# Patient Record
Sex: Female | Born: 1955 | Race: Black or African American | Hispanic: No | Smoking: Never smoker
Health system: Southern US, Community
[De-identification: ages and names within clinical notes are randomized; demographics above are authoritative.]

## PROBLEM LIST (undated history)

## (undated) DIAGNOSIS — E038 Other specified hypothyroidism: Secondary | ICD-10-CM

## (undated) DIAGNOSIS — E039 Hypothyroidism, unspecified: Secondary | ICD-10-CM

## (undated) HISTORY — DX: Hypothyroidism, unspecified: E03.9

## (undated) HISTORY — DX: Other specified hypothyroidism: E03.8

## (undated) HISTORY — PX: EYELID LACERATION REPAIR: SHX1564

---

## 2006-12-02 ENCOUNTER — Emergency Department (HOSPITAL_COMMUNITY): Admission: EM | Admit: 2006-12-02 | Discharge: 2006-12-02 | Payer: Self-pay | Admitting: Family Medicine

## 2008-06-03 IMAGING — CR DG CHEST 2V
2 series · 2 of 2 positions shown · non-contrast
Comparison: None available.

CLINICAL DATA: Cough.  
 CHEST - 2 VIEW:

[view not recorded (1 of 2)]
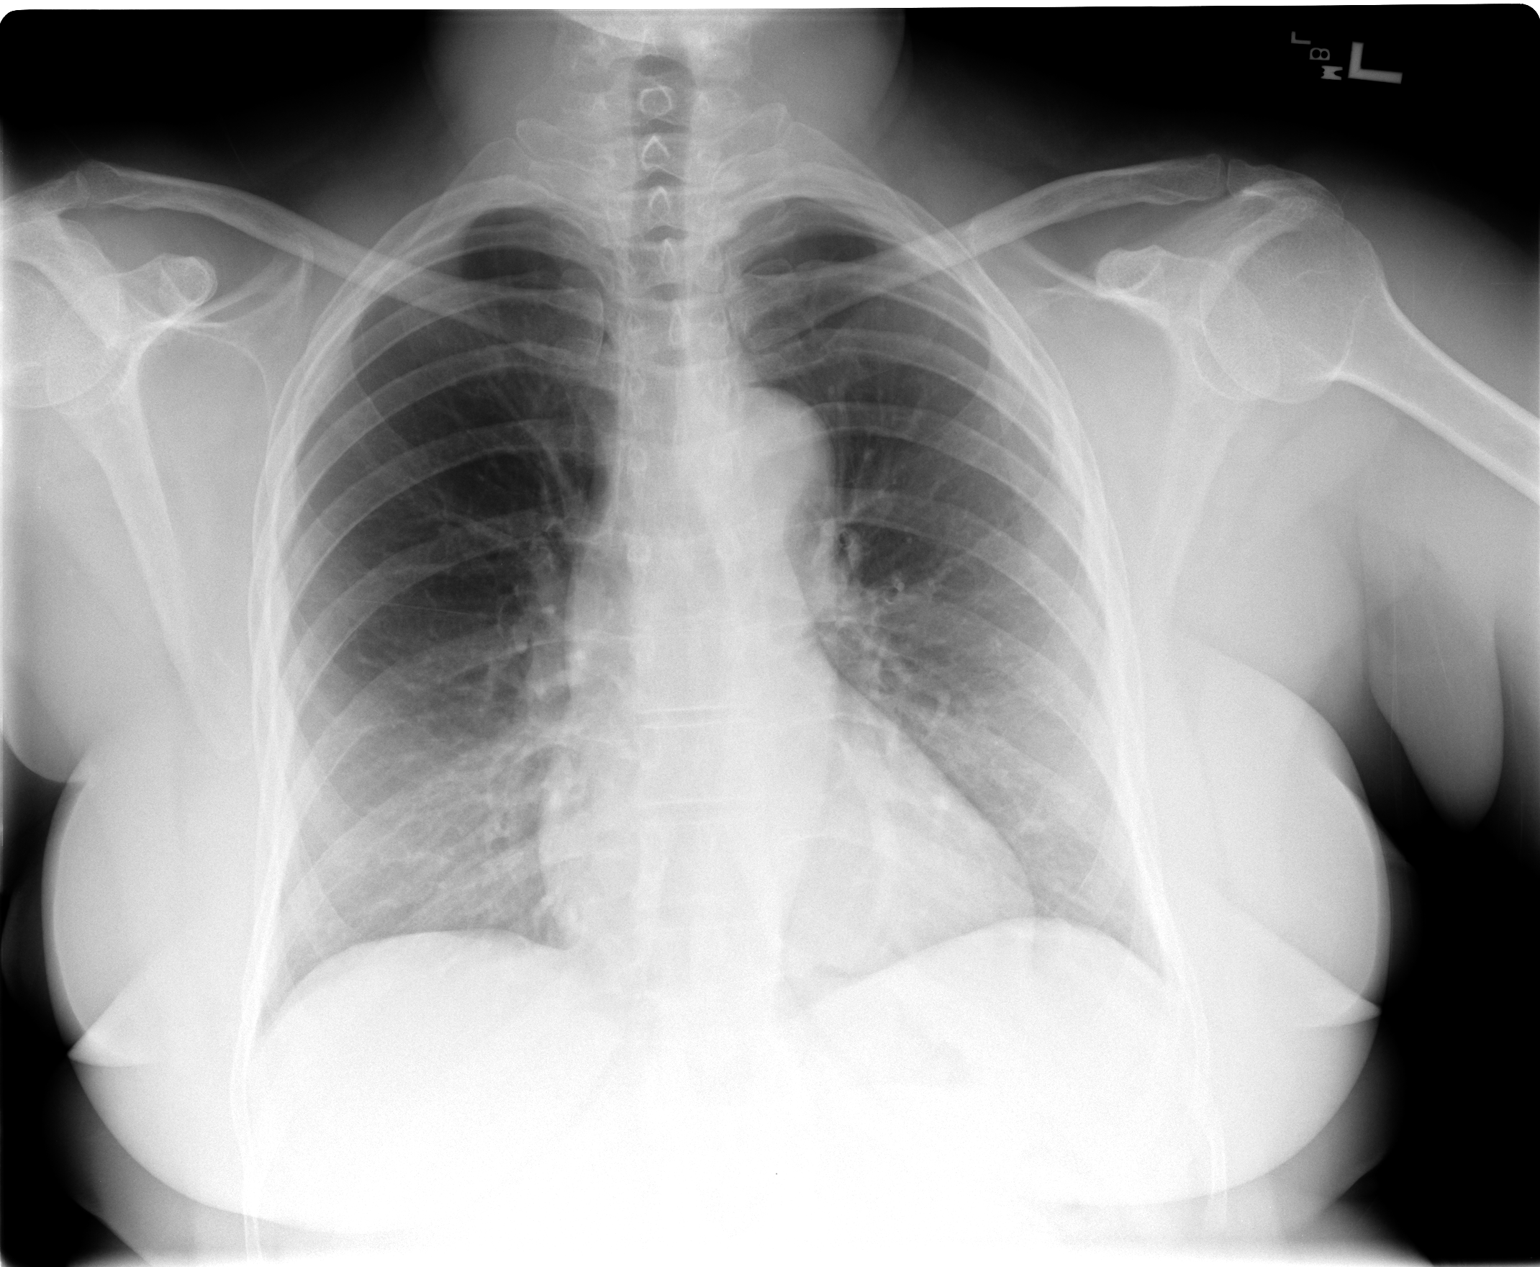

[view not recorded (2 of 2)]
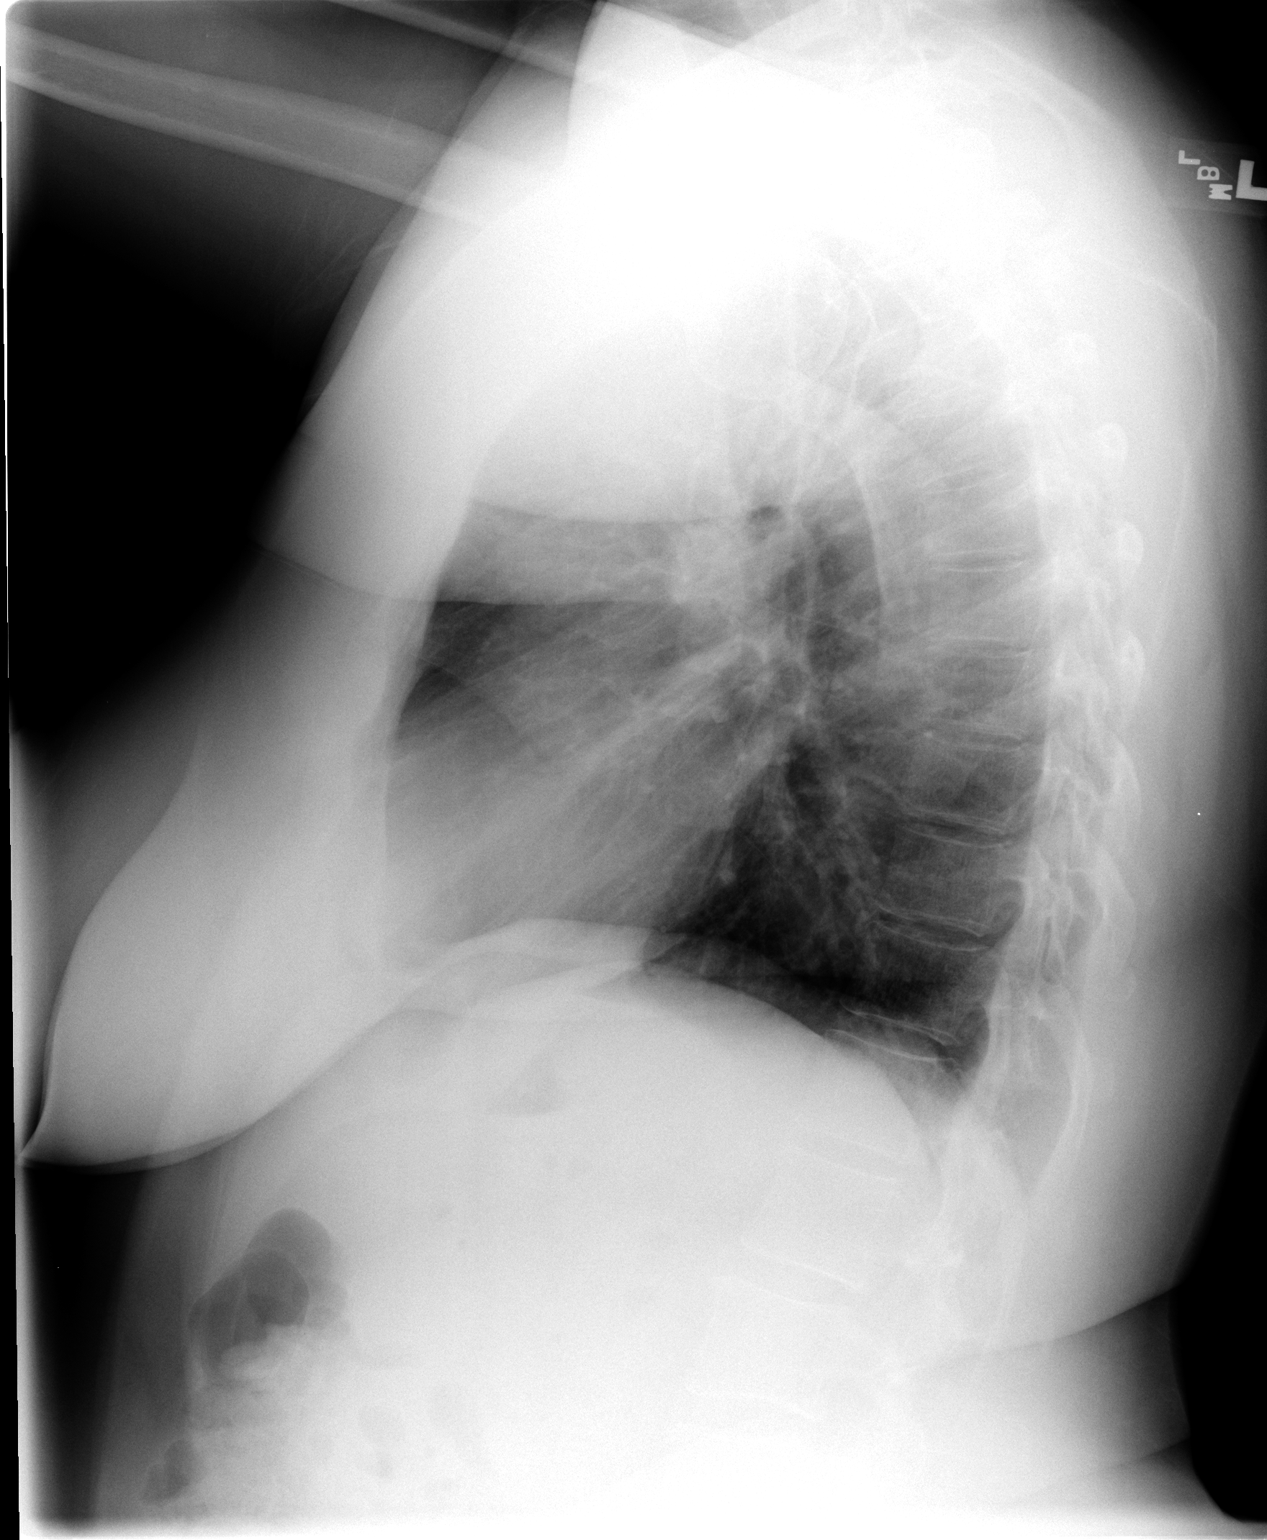

[2 of 2 positions shown; findings below may reference images not displayed]

FINDINGS: Trachea is midline.  Heart size normal.  Lungs are clear.  No pleural fluid.
IMPRESSION: No acute findings.

## 2013-03-25 ENCOUNTER — Ambulatory Visit: Payer: Self-pay | Admitting: Obstetrics and Gynecology

## 2014-09-25 IMAGING — US ABDOMEN ULTRASOUND
1 series · 14 of 25 positions shown · non-contrast
Comparison: none

REASON FOR EXAM: RUQ pain
COMMENTS:

PROCEDURE:     US  - US ABDOMEN GENERAL SURVEY  - March 25, 2013 [DATE]
RESULT:

[Series 1: abdomen ultrasound · 0.26mm/px · 14 of 91 slices shown]
[im 1/91]
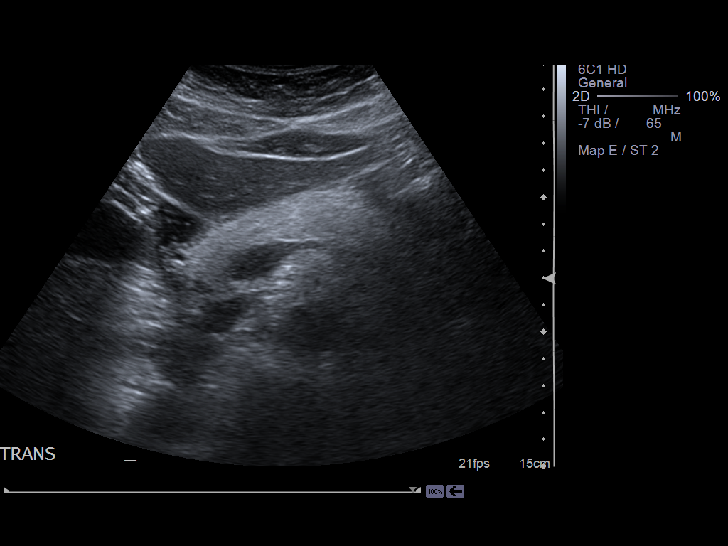
[im 8/91]
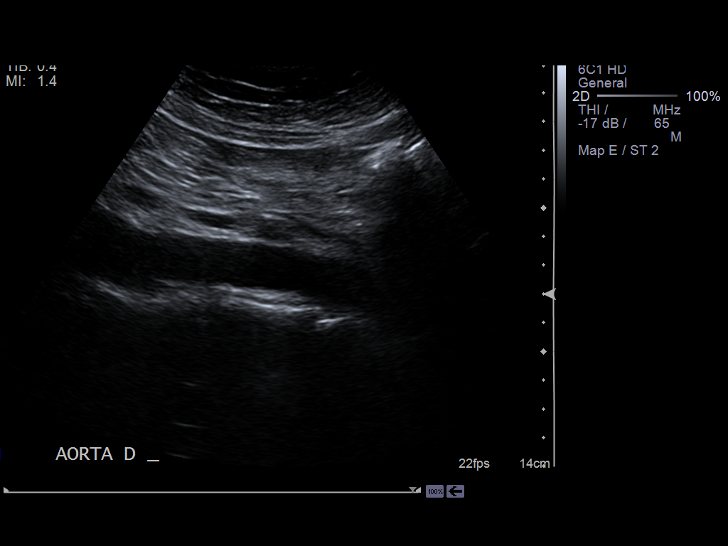
[im 16/91]
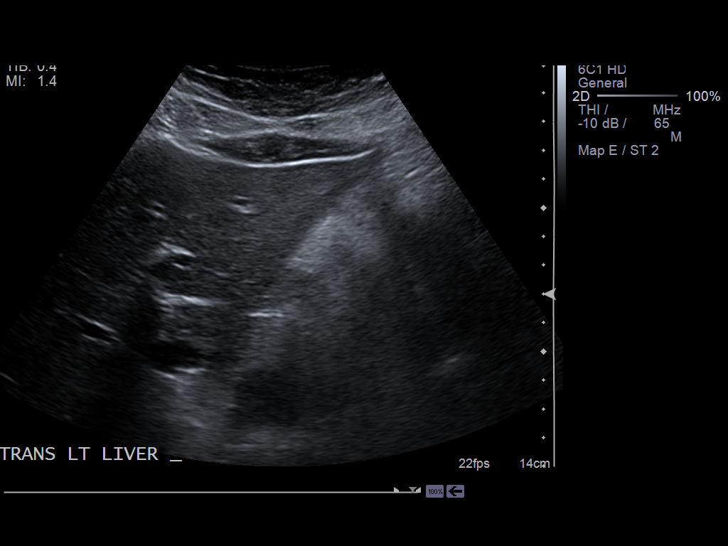
[im 23/91]
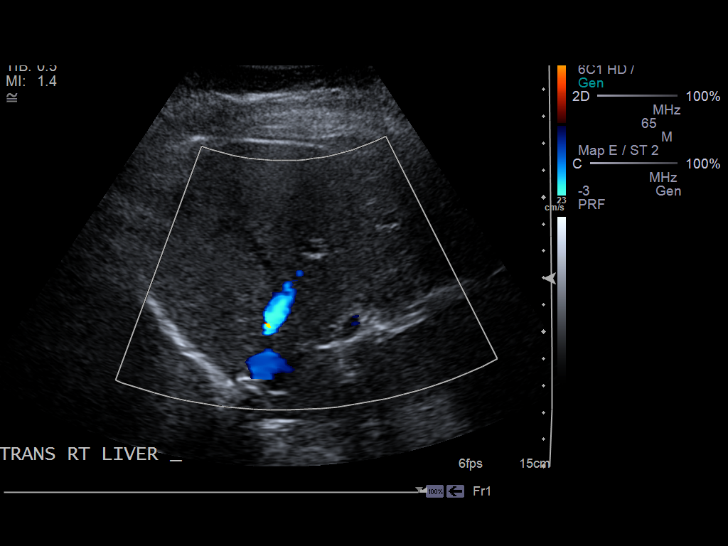
[im 31/91]
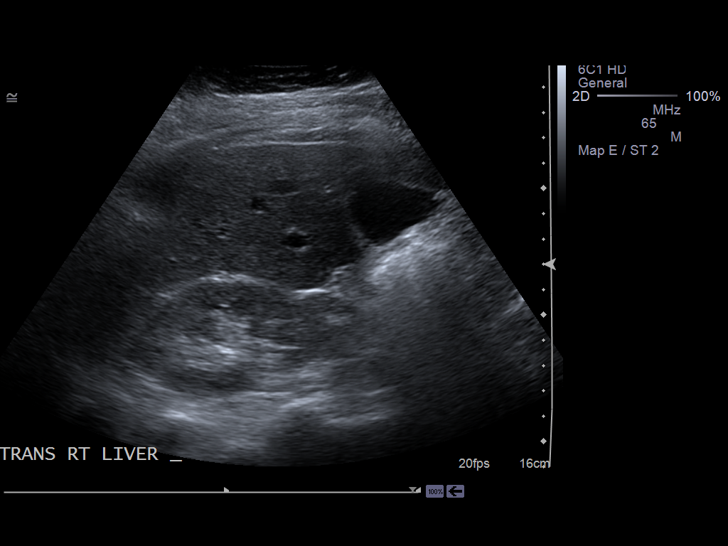
[im 34/91]
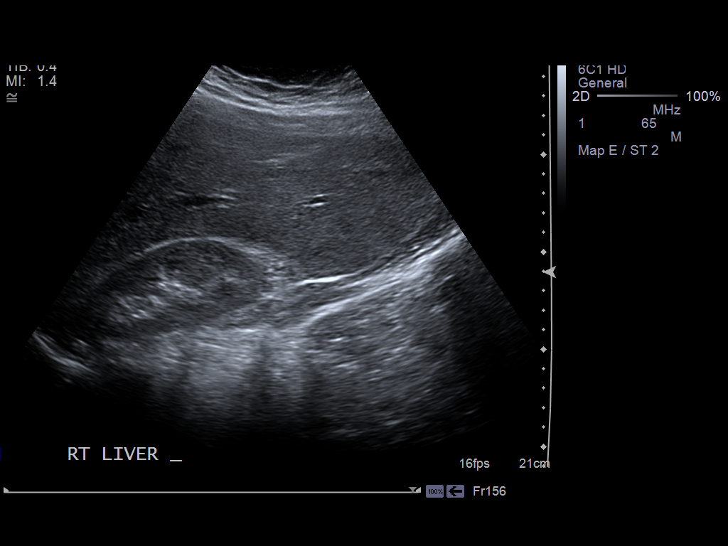
[im 42/91]
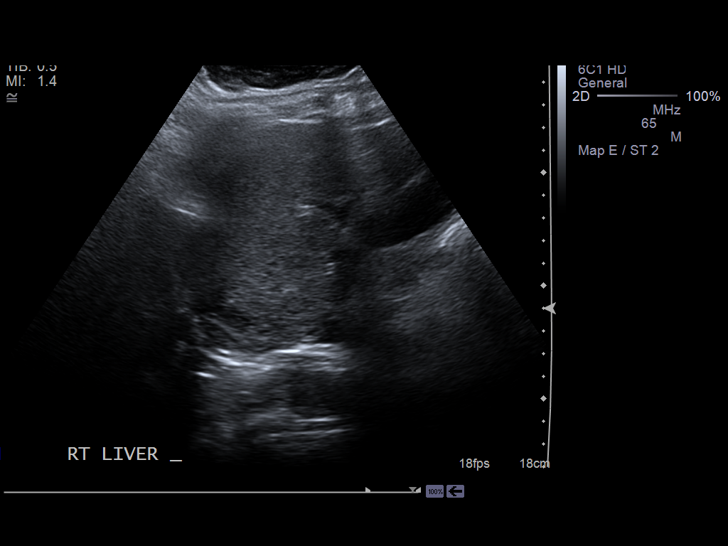
[im 49/91]
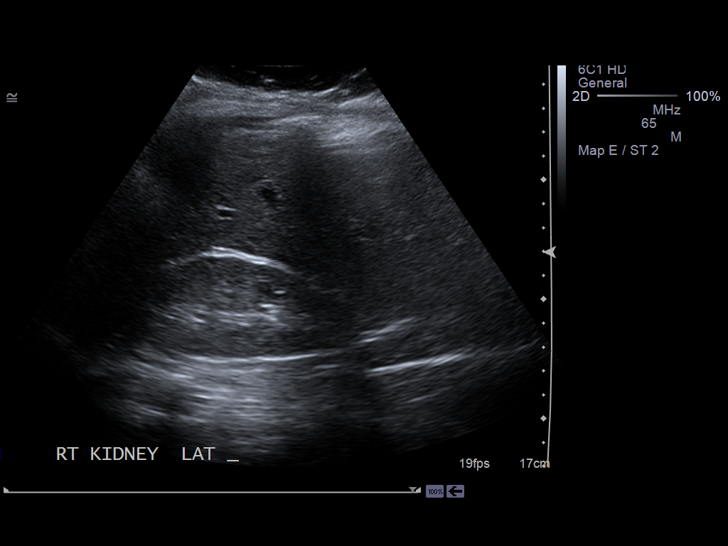
[im 57/91]
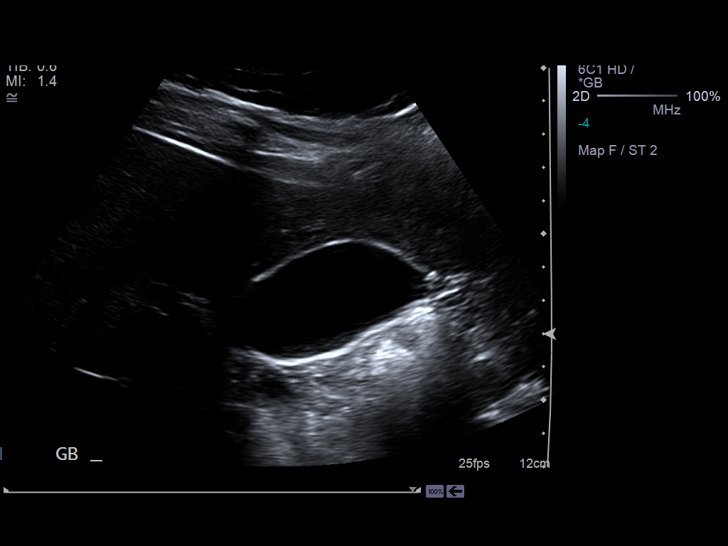
[im 61/91]
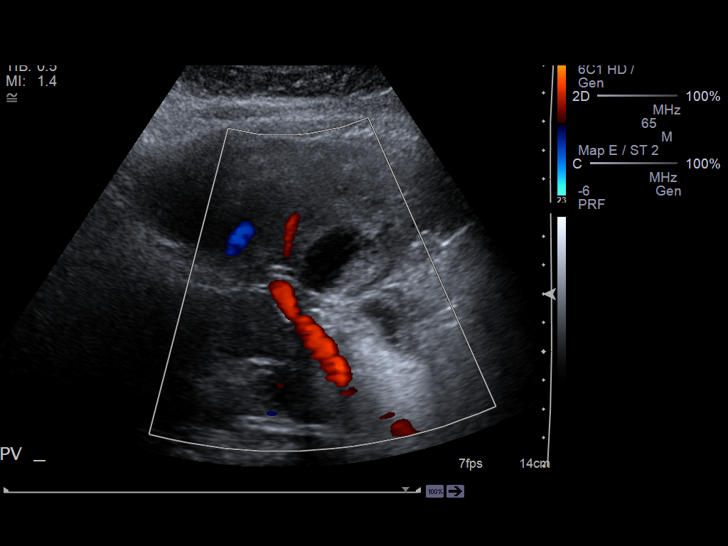
[im 68/91]
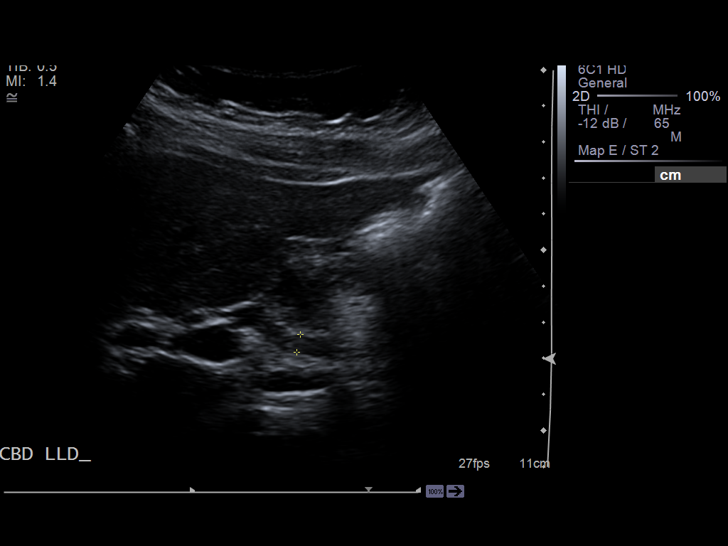
[im 76/91]
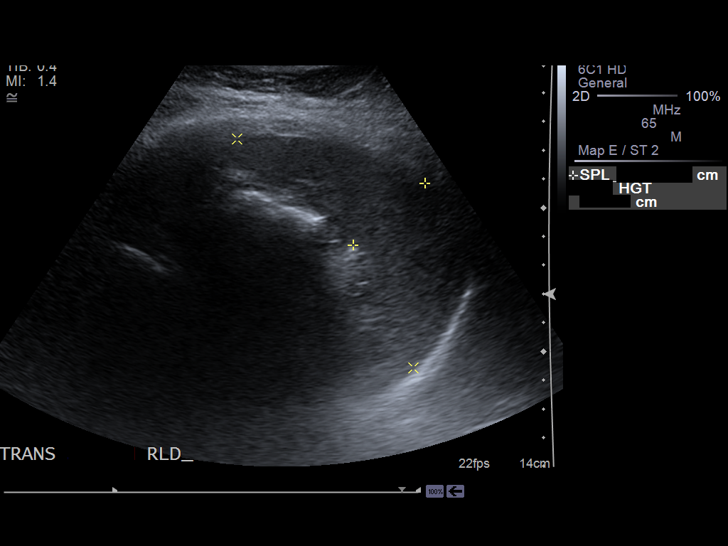
[im 83/91]
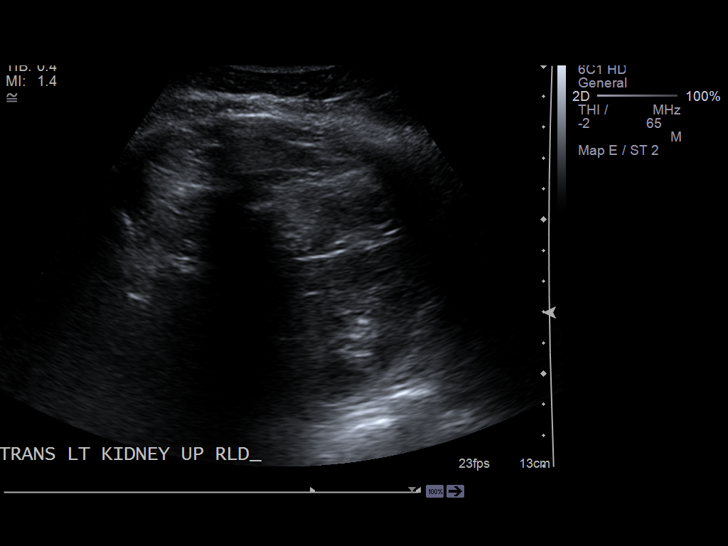
[im 91/91]
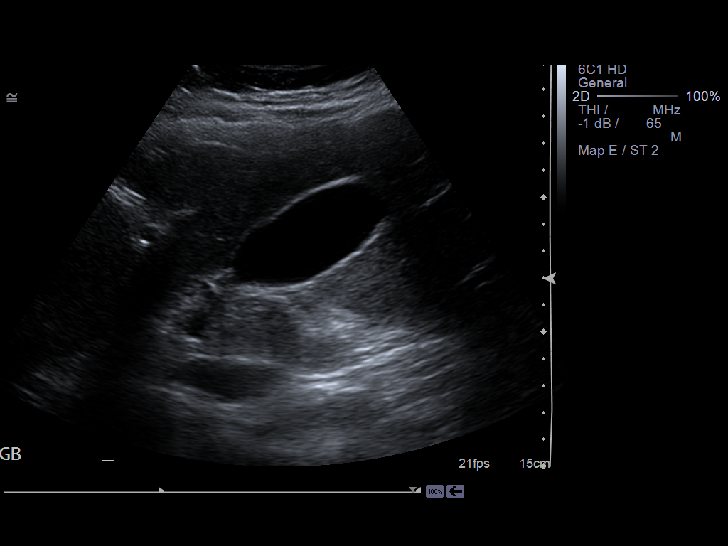

[14 of 25 positions shown; findings below may reference images not displayed]

FINDINGS: The liver demonstrates a homogeneous echotexture and measures
22.03 cm along the midclavicular line. A cyst is identified within the left
lobe of the liver. Hepatopetal flow is identified within the portal vein.
The aorta and IVC are unremarkable. The visualized portions of the pancreas
are unremarkable. The spleen demonstrates a homogeneous echotexture and
measures 8.89 cm in longitudinal dimensions. The gallbladder fossa is
unremarkable without evidence of pericholecystic fluid, gallstones,
sludging, sonographic Murphy's sign, gallbladder wall thickening, or common
bile duct dilation. Gallbladder wall is 1.8 mm in thickness and the common
bile duct measures 5.1 mm in diameter.

The right kidney measures 11.44 x 4.62 x 4.27 cm and the left 12.05 x 4.32 x
4.48 cm. There is appropriate corticomedullary differentiation without
evidence of hydronephrosis, solid or cystic masses nor calculi.
IMPRESSION: 1. Cyst within the left lobe of the liver.
2. Hepatomegaly.
3. Otherwise unremarkable abdominal ultrasound.

## 2016-01-10 ENCOUNTER — Other Ambulatory Visit: Payer: Self-pay | Admitting: Obstetrics and Gynecology

## 2016-01-10 DIAGNOSIS — Z1231 Encounter for screening mammogram for malignant neoplasm of breast: Secondary | ICD-10-CM

## 2016-01-31 ENCOUNTER — Ambulatory Visit
Admission: RE | Admit: 2016-01-31 | Discharge: 2016-01-31 | Disposition: A | Discharge status: Home / Self Care | Payer: BLUE CROSS/BLUE SHIELD | Source: Ambulatory Visit | Attending: Obstetrics and Gynecology | Admitting: Obstetrics and Gynecology

## 2016-01-31 ENCOUNTER — Other Ambulatory Visit: Payer: Self-pay | Admitting: Obstetrics and Gynecology

## 2016-01-31 DIAGNOSIS — R059 Cough, unspecified: Secondary | ICD-10-CM

## 2016-01-31 DIAGNOSIS — Z1231 Encounter for screening mammogram for malignant neoplasm of breast: Secondary | ICD-10-CM

## 2016-01-31 DIAGNOSIS — N6320 Unspecified lump in the left breast, unspecified quadrant: Secondary | ICD-10-CM

## 2016-01-31 DIAGNOSIS — R05 Cough: Secondary | ICD-10-CM | POA: Insufficient documentation

## 2016-01-31 DIAGNOSIS — N63 Unspecified lump in breast: Secondary | ICD-10-CM | POA: Diagnosis not present

## 2017-08-02 IMAGING — CR DG CHEST 2V
1 series · 2 of 2 positions shown · non-contrast
Comparison: 12/02/2006 radiographs

CLINICAL DATA: Cough for 1 month.

EXAM:
CHEST  2 VIEW

[Series 1: dg chest 2 view · 0.14mm/px · 2 of 2 slices shown]
[im 1/2]
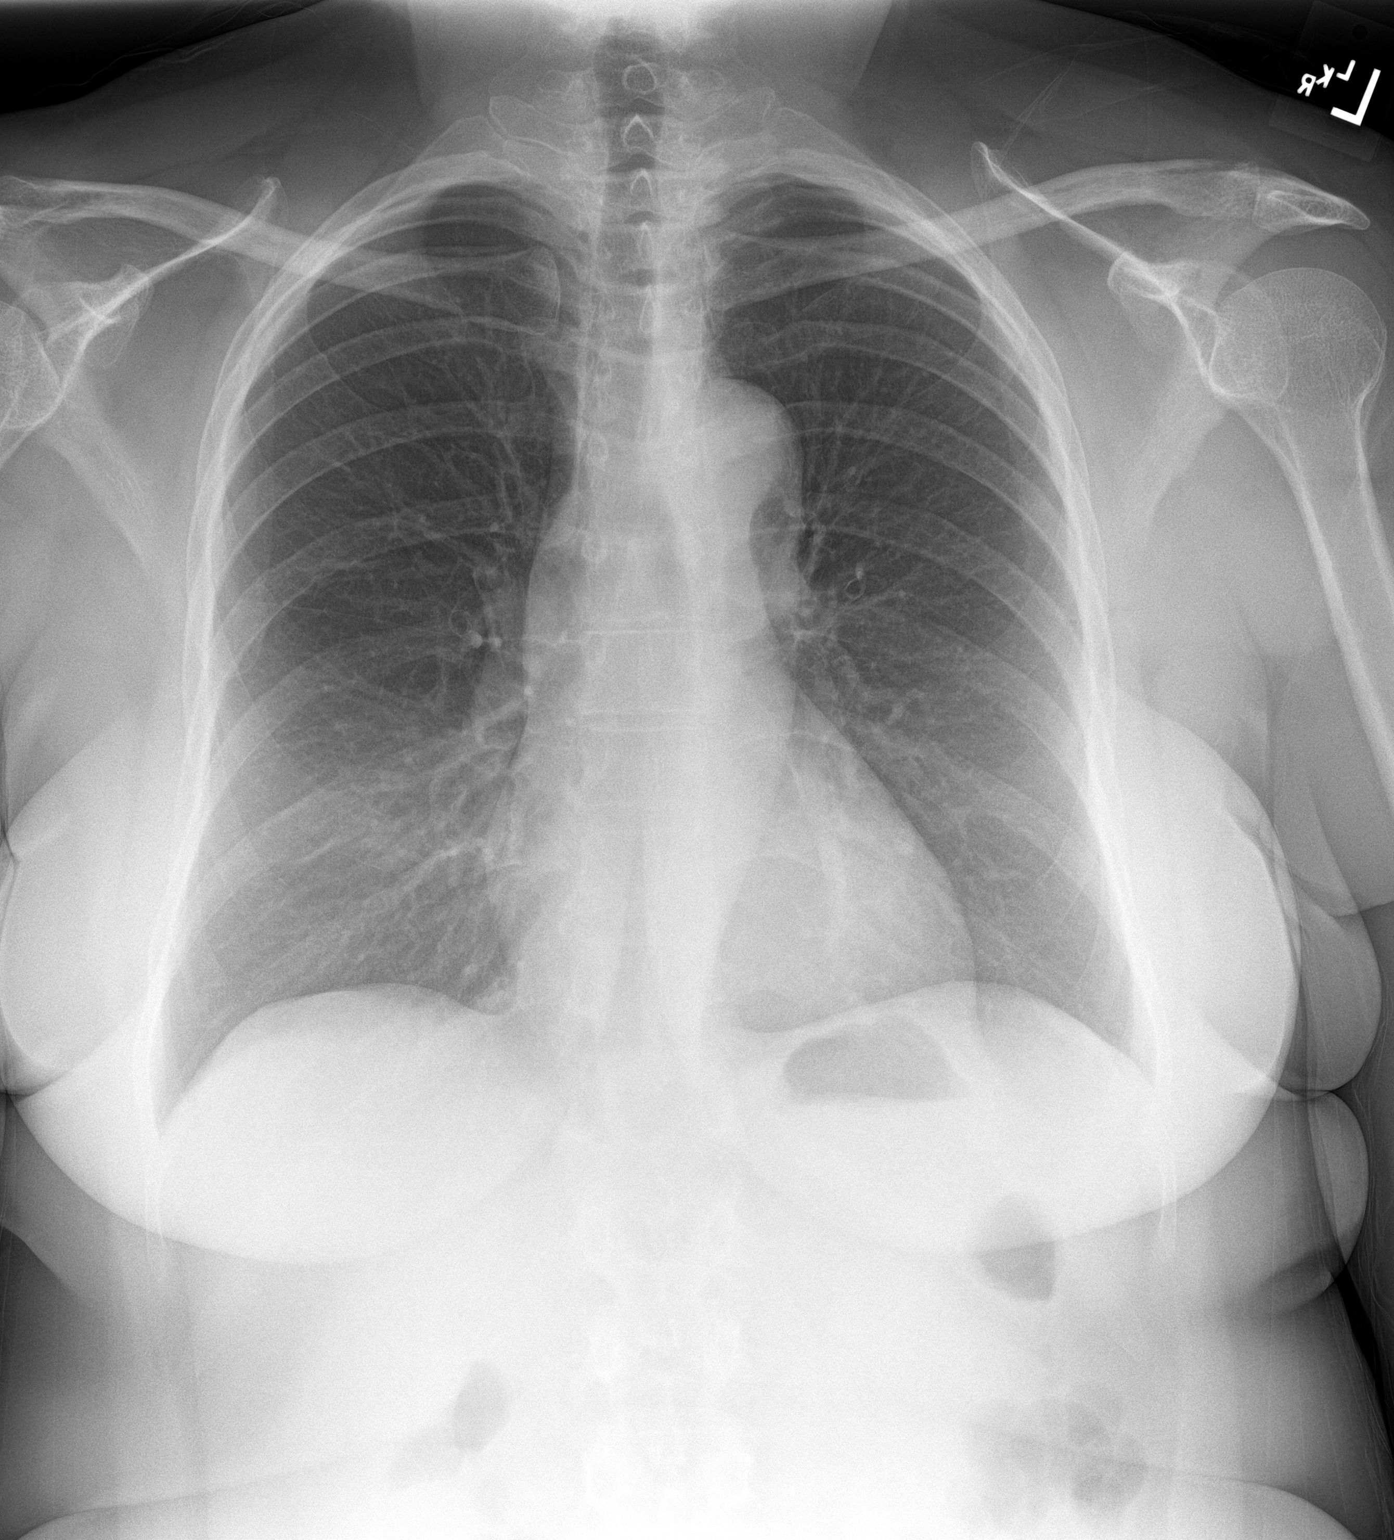
[im 2/2]
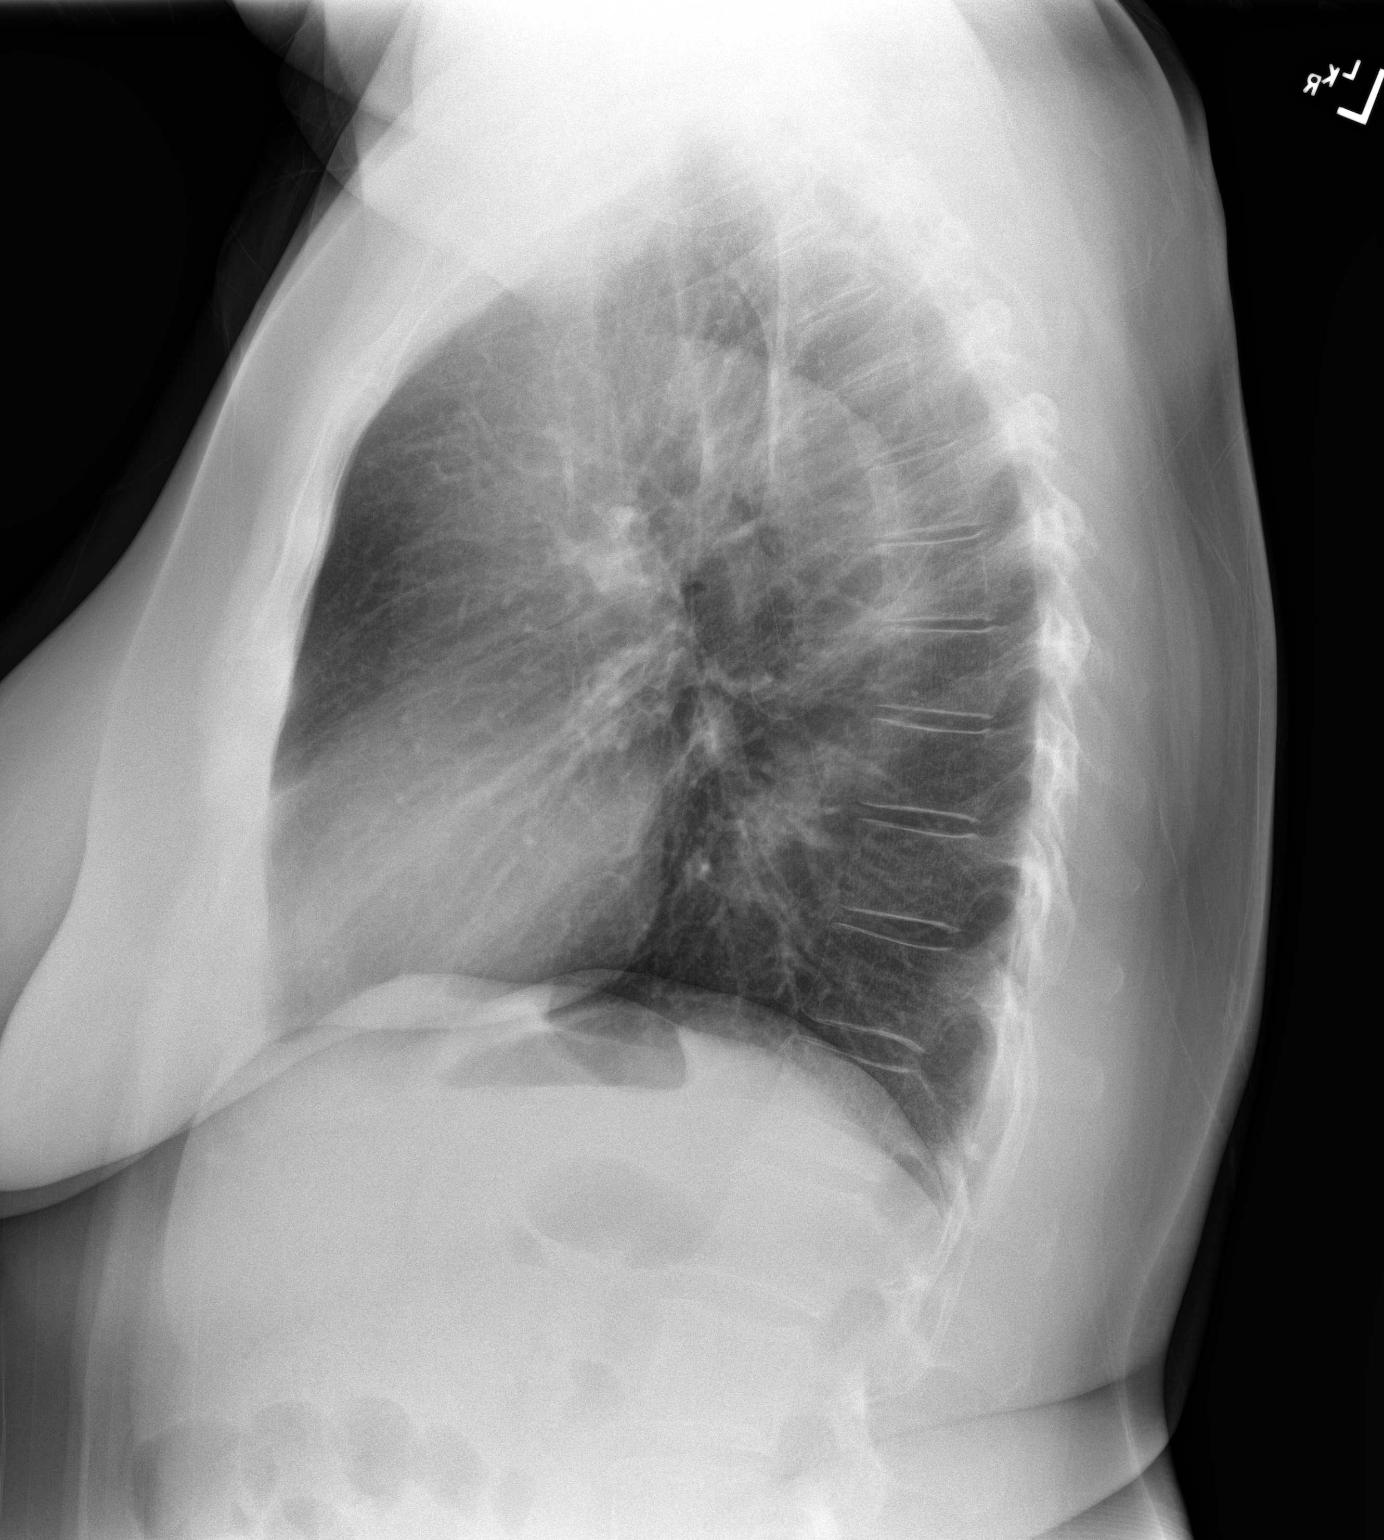

[2 of 2 positions shown; findings below may reference images not displayed]

FINDINGS: The cardiomediastinal silhouette is unremarkable.

There is no evidence of focal airspace disease, pulmonary edema,
suspicious pulmonary nodule/mass, pleural effusion, or pneumothorax.
No acute bony abnormalities are identified.
IMPRESSION: No active cardiopulmonary disease.

## 2017-08-25 DIAGNOSIS — J019 Acute sinusitis, unspecified: Secondary | ICD-10-CM | POA: Diagnosis not present

## 2017-08-25 DIAGNOSIS — B9689 Other specified bacterial agents as the cause of diseases classified elsewhere: Secondary | ICD-10-CM | POA: Diagnosis not present

## 2017-08-25 DIAGNOSIS — J039 Acute tonsillitis, unspecified: Secondary | ICD-10-CM | POA: Diagnosis not present

## 2017-09-09 ENCOUNTER — Encounter: Payer: Self-pay | Admitting: Obstetrics & Gynecology

## 2017-09-10 ENCOUNTER — Ambulatory Visit (INDEPENDENT_AMBULATORY_CARE_PROVIDER_SITE_OTHER): Payer: BLUE CROSS/BLUE SHIELD | Admitting: Obstetrics & Gynecology

## 2017-09-10 ENCOUNTER — Encounter: Payer: Self-pay | Admitting: Obstetrics & Gynecology

## 2017-09-10 VITALS — BP 118/80 | Ht 62.0 in | Wt 211.0 lb

## 2017-09-10 DIAGNOSIS — Z1211 Encounter for screening for malignant neoplasm of colon: Secondary | ICD-10-CM

## 2017-09-10 DIAGNOSIS — R7989 Other specified abnormal findings of blood chemistry: Secondary | ICD-10-CM | POA: Diagnosis not present

## 2017-09-10 DIAGNOSIS — Z1231 Encounter for screening mammogram for malignant neoplasm of breast: Secondary | ICD-10-CM | POA: Diagnosis not present

## 2017-09-10 DIAGNOSIS — Z0000 Encounter for general adult medical examination without abnormal findings: Secondary | ICD-10-CM

## 2017-09-10 DIAGNOSIS — Z1239 Encounter for other screening for malignant neoplasm of breast: Secondary | ICD-10-CM

## 2017-09-10 MED ORDER — LEVOFLOXACIN 500 MG PO TABS: 500.0000 mg | ORAL_TABLET | Freq: Every day | ORAL | 0 refills | Status: AC

## 2017-09-10 NOTE — Patient Instructions (Signed)
PAP every three years Mammogram every year    Call [phone] [facility] or Cologuard every 10 years Labs yearly (with PCP)

## 2017-09-10 NOTE — Progress Notes (Signed)
HPI:      [name] is a 62 y.o. [id number] who LMP was in the past, she presents today for her annual examination.  The patient has no complaints today. The patient is sexually active. Herlast pap: approximate date 2017 and was normal and last mammogram: approximate date 2018 and was normal.  The patient does perform self breast exams.  There is notable family history of breast or ovarian cancer in her family. The patient is not taking hormone replacement therapy. Patient denies post-menopausal vaginal bleeding.   The patient has regular exercise: yes. The patient denies current symptoms of depression.    Weight gain a concern, cannot seem to lose (fluctuates up and down 10 lbs).  Recent high TSH when done at bariatric referral appt.  GYN Hx: Last Colonoscopy:never ago. Does not want colonoscopy as fears complications.  Would prefer Cologuard.  Last DEXA: never ago.    PMHx: Past Medical History:  Diagnosis Date  . Subclinical hypothyroidism    History reviewed. No pertinent surgical history. Family History  Problem Relation Age of Onset  . Breast cancer Mother    Social History   Tobacco Use  . Smoking status: Never Smoker  . Smokeless tobacco: Never Used  Substance Use Topics  . Alcohol use: Yes  . Drug use: No    Current Outpatient Medications:  .  albuterol (PROVENTIL HFA) 108 (90 Base) MCG/ACT inhaler, Inhale into the lungs., Disp: , Rfl:  .  metFORMIN (GLUCOPHAGE) 500 MG tablet, Take by mouth 2 (two) times daily with a meal., Disp: , Rfl:  .  levofloxacin (LEVAQUIN) 500 MG tablet, Take 1 tablet (500 mg total) by mouth daily for 7 days., Disp: 7 tablet, Rfl: 0 Allergies: Amoxicillin-pot clavulanate  Review of Systems  Constitutional: Negative for chills, fever and malaise/fatigue.  HENT: Negative for congestion, sinus pain and sore throat.   Eyes: Negative for blurred vision and pain.  Respiratory: Negative for cough and wheezing.   Cardiovascular: Negative  for chest pain and leg swelling.  Gastrointestinal: Negative for abdominal pain, constipation, diarrhea, heartburn, nausea and vomiting.  Genitourinary: Negative for dysuria, frequency, hematuria and urgency.  Musculoskeletal: Negative for back pain, joint pain, myalgias and neck pain.  Skin: Negative for itching and rash.  Neurological: Negative for dizziness, tremors and weakness.  Endo/Heme/Allergies: Does not bruise/bleed easily.  Psychiatric/Behavioral: Negative for depression. The patient is not nervous/anxious and does not have insomnia.     Objective: BP 118/80   Ht 5\' 2"  (1.575 m)   Wt 211 lb (95.7 kg)   BMI 38.59 kg/m   Filed Weights   09/10/17 1439  Weight: 211 lb (95.7 kg)   Body mass index is 38.59 kg/m. Physical Exam  Constitutional: She is oriented to person, place, and time. She appears well-developed and well-nourished. No distress.  Genitourinary: Rectum normal, vagina normal and uterus normal. Pelvic exam was performed with patient supine. There is no rash or lesion on the right labia. There is no rash or lesion on the left labia. Vagina exhibits no lesion. No bleeding in the vagina. Right adnexum does not display mass and does not display tenderness. Left adnexum does not display mass and does not display tenderness. Cervix does not exhibit motion tenderness, lesion, friability or polyp.   Uterus is mobile and midaxial. Uterus is not enlarged or exhibiting a mass.  HENT:  Head: Normocephalic and atraumatic. Head is without laceration.  Right Ear: Hearing normal.  Left Ear: Hearing normal.  Nose:  No epistaxis.  No foreign bodies.  Mouth/Throat: Uvula is midline, oropharynx is clear and moist and mucous membranes are normal.  Eyes: Pupils are equal, round, and reactive to light.  Neck: Normal range of motion. Neck supple. No thyromegaly present.  Cardiovascular: Normal rate and regular rhythm. Exam reveals no gallop and no friction rub.  No murmur  heard. Pulmonary/Chest: Effort normal and breath sounds normal. No respiratory distress. She has no wheezes. Right breast exhibits no mass, no skin change and no tenderness. Left breast exhibits no mass, no skin change and no tenderness.  Abdominal: Soft. Bowel sounds are normal. She exhibits no distension. There is no tenderness. There is no rebound.  Musculoskeletal: Normal range of motion.  Neurological: She is alert and oriented to person, place, and time. No cranial nerve deficit.  Skin: Skin is warm and dry.  Psychiatric: She has a normal mood and affect. Judgment normal.  Vitals reviewed.  Assessment: Annual Exam 1. Screening for breast cancer   2. Screen for colon cancer   3. Abnormal TSH   4. Annual physical exam    Plan:            1.  Cervical Screening-  Pap smear schedule reviewed with patient  2. Breast screening- Exam annually and mammogram scheduled  3. Colonoscopy every 10 years, Hemoccult testing after age 60. Prefers Cologuard.  Refer to GI to counsel and plan.  4. Labs Ordered today.  Thyroid panel today  5. Counseling for hormonal therapy: none, no change in therapy today 6. Recent abnormal TSH at bariatric center;  Full panel today  7. Levofloxacin for URI    F/U  Return in about 1 year (around 09/10/2018) for Annual.  Annamarie MajorPaul Heraclio Seidman, MD, Merlinda FrederickFACOG [facility], [facility] 09/10/2017  3:00 PM

## 2017-09-11 ENCOUNTER — Other Ambulatory Visit: Payer: Self-pay | Admitting: Obstetrics & Gynecology

## 2017-09-11 DIAGNOSIS — E039 Hypothyroidism, unspecified: Secondary | ICD-10-CM | POA: Insufficient documentation

## 2017-09-11 LAB — THYROID PANEL WITH TSH
Free Thyroxine Index: 1.3 (ref 1.2–4.9)
T3 Uptake Ratio: 21 % — ABNORMAL LOW (ref 24–39)
T4, Total: 6 ug/dL (ref 4.5–12.0)
TSH: 11.98 u[IU]/mL — ABNORMAL HIGH (ref 0.450–4.500)

## 2017-09-11 MED ORDER — LEVOTHYROXINE SODIUM 100 MCG PO TABS: 100.0000 ug | ORAL_TABLET | Freq: Every day | ORAL | 11 refills | Status: DC

## 2017-09-11 NOTE — Progress Notes (Signed)
Thyroid panel discussed w patient Low, rec replacement therapy Rx done Recheck level in 6 weeks Annamarie MajorPaul Martika Egler, MD, Merlinda FrederickFACOG [facility], [facility] 09/11/2017  10:09 AM

## 2017-09-17 ENCOUNTER — Encounter: Payer: Self-pay | Admitting: *Deleted

## 2017-10-16 DIAGNOSIS — R05 Cough: Secondary | ICD-10-CM | POA: Diagnosis not present

## 2017-10-16 DIAGNOSIS — J40 Bronchitis, not specified as acute or chronic: Secondary | ICD-10-CM | POA: Diagnosis not present

## 2018-01-13 ENCOUNTER — Telehealth: Payer: Self-pay

## 2018-01-13 ENCOUNTER — Other Ambulatory Visit: Payer: Self-pay | Admitting: Obstetrics & Gynecology

## 2018-01-13 DIAGNOSIS — E039 Hypothyroidism, unspecified: Secondary | ICD-10-CM

## 2018-01-13 NOTE — Telephone Encounter (Signed)
Pt aware of mammo and blood work, states she will make a appointment soon.

## 2018-03-24 DIAGNOSIS — R05 Cough: Secondary | ICD-10-CM | POA: Diagnosis not present

## 2018-03-24 DIAGNOSIS — J40 Bronchitis, not specified as acute or chronic: Secondary | ICD-10-CM | POA: Diagnosis not present

## 2018-04-20 ENCOUNTER — Telehealth: Payer: Self-pay

## 2018-04-20 NOTE — Telephone Encounter (Signed)
-----   Message from Nadara Mustardobert P Harris, MD sent at 04/20/2018  8:53 AM EDT ----- Regarding: MMG needed Received notice she has not received MMG yet as ordered at her Annual in FEB Please check and encourage her to do this, and document conversation.

## 2018-04-20 NOTE — Telephone Encounter (Signed)
Left message to remind pt to schedule mammo as the orders was placed in Feb,2019

## 2018-05-29 DIAGNOSIS — J209 Acute bronchitis, unspecified: Secondary | ICD-10-CM | POA: Diagnosis not present

## 2018-05-29 DIAGNOSIS — J40 Bronchitis, not specified as acute or chronic: Secondary | ICD-10-CM | POA: Diagnosis not present

## 2018-08-16 DIAGNOSIS — Z23 Encounter for immunization: Secondary | ICD-10-CM | POA: Diagnosis not present

## 2018-09-18 ENCOUNTER — Other Ambulatory Visit: Payer: Self-pay | Admitting: Obstetrics & Gynecology

## 2018-09-20 ENCOUNTER — Telehealth: Payer: Self-pay

## 2018-09-20 NOTE — Telephone Encounter (Signed)
Can you schedule annual with rph please and her rx has been filled .

## 2018-09-20 NOTE — Telephone Encounter (Signed)
Called and left voice mail for patient to call back to be schedule °

## 2018-09-20 NOTE — Telephone Encounter (Signed)
Needs annual and thyroid check.  Please schedule.  Refill done.

## 2018-10-06 ENCOUNTER — Encounter: Payer: Self-pay | Admitting: Obstetrics & Gynecology

## 2018-10-06 ENCOUNTER — Other Ambulatory Visit (HOSPITAL_COMMUNITY)
Admission: RE | Admit: 2018-10-06 | Discharge: 2018-10-06 | Disposition: A | Discharge status: Home / Self Care | Payer: BLUE CROSS/BLUE SHIELD | Source: Ambulatory Visit | Attending: Obstetrics & Gynecology | Admitting: Obstetrics & Gynecology

## 2018-10-06 ENCOUNTER — Ambulatory Visit (INDEPENDENT_AMBULATORY_CARE_PROVIDER_SITE_OTHER): Payer: BLUE CROSS/BLUE SHIELD | Admitting: Obstetrics & Gynecology

## 2018-10-06 VITALS — BP 128/88 | Ht 63.0 in | Wt 223.0 lb

## 2018-10-06 DIAGNOSIS — Z01419 Encounter for gynecological examination (general) (routine) without abnormal findings: Secondary | ICD-10-CM | POA: Diagnosis not present

## 2018-10-06 DIAGNOSIS — Z1321 Encounter for screening for nutritional disorder: Secondary | ICD-10-CM | POA: Diagnosis not present

## 2018-10-06 DIAGNOSIS — E039 Hypothyroidism, unspecified: Secondary | ICD-10-CM

## 2018-10-06 DIAGNOSIS — Z0000 Encounter for general adult medical examination without abnormal findings: Secondary | ICD-10-CM

## 2018-10-06 DIAGNOSIS — Z124 Encounter for screening for malignant neoplasm of cervix: Secondary | ICD-10-CM | POA: Insufficient documentation

## 2018-10-06 DIAGNOSIS — Z13220 Encounter for screening for lipoid disorders: Secondary | ICD-10-CM | POA: Diagnosis not present

## 2018-10-06 DIAGNOSIS — Z1211 Encounter for screening for malignant neoplasm of colon: Secondary | ICD-10-CM

## 2018-10-06 DIAGNOSIS — Z131 Encounter for screening for diabetes mellitus: Secondary | ICD-10-CM

## 2018-10-06 DIAGNOSIS — Z1239 Encounter for other screening for malignant neoplasm of breast: Secondary | ICD-10-CM

## 2018-10-06 MED ORDER — LEVOTHYROXINE SODIUM 100 MCG PO TABS: ORAL_TABLET | ORAL | 11 refills | Status: DC

## 2018-10-06 MED ORDER — METFORMIN HCL 500 MG PO TABS: 500.0000 mg | ORAL_TABLET | Freq: Two times a day (BID) | ORAL | 11 refills | Status: DC

## 2018-10-06 MED ORDER — ALBUTEROL SULFATE HFA 108 (90 BASE) MCG/ACT IN AERS: 1.0000 | INHALATION_SPRAY | Freq: Four times a day (QID) | RESPIRATORY_TRACT | 3 refills | Status: AC | PRN

## 2018-10-06 NOTE — Progress Notes (Signed)
HPI:      [name] is a 63 y.o. [phone] who LMP was in the past, she presents today for her annual examination.  The patient has no complaints today. The patient is sexually active. Herlast pap: approximate date 2017 and was normal and last mammogram: approximate date 2019 and was normal.  The patient does perform self breast exams.  There is notable family history of breast or ovarian cancer in her family. The patient is not taking hormone replacement therapy. Patient denies post-menopausal vaginal bleeding.   The patient has regular exercise: yes. The patient denies current symptoms of depression.  Weight gain a concern    GYN Hx: Last Colonoscopy:never ago.  PMHx: Past Medical History:  Diagnosis Date  . Subclinical hypothyroidism    History reviewed. No pertinent surgical history. Family History  Problem Relation Age of Onset  . Breast cancer Mother    Social History   Tobacco Use  . Smoking status: Never Smoker  . Smokeless tobacco: Never Used  Substance Use Topics  . Alcohol use: Yes  . Drug use: No    Current Outpatient Medications:  .  albuterol (PROVENTIL HFA) 108 (90 Base) MCG/ACT inhaler, Inhale 1 puff into the lungs every 6 (six) hours as needed for wheezing or shortness of breath., Disp: 1 Inhaler, Rfl: 3 .  levothyroxine (SYNTHROID, LEVOTHROID) 100 MCG tablet, TAKE 1 TABLET BY MOUTH ONCE DAILY BEFORE BREAKFAST, Disp: 30 tablet, Rfl: 11 .  metFORMIN (GLUCOPHAGE) 500 MG tablet, Take 1 tablet (500 mg total) by mouth 2 (two) times daily with a meal., Disp: 60 tablet, Rfl: 11 Allergies: Amoxicillin-pot clavulanate  Review of Systems  All other systems reviewed and are negative.   Objective: BP 128/88   Ht 5\' 3"  (1.6 m)   Wt 223 lb (101.2 kg)   BMI 39.50 kg/m   Filed Weights   10/06/18 1001  Weight: 223 lb (101.2 kg)   Body mass index is 39.5 kg/m. Physical Exam Constitutional:      General: She is not in acute distress.    Appearance: She is  well-developed.  Genitourinary:     Pelvic exam was performed with patient supine.     Vagina, uterus and rectum normal.     No lesions in the vagina.     No vaginal bleeding.     No cervical motion tenderness, friability, lesion or polyp.     Uterus is mobile.     Uterus is not enlarged.     No uterine mass detected.    Uterus is midaxial.     No right or left adnexal mass present.     Right adnexa not tender.     Left adnexa not tender.  HENT:     Head: Normocephalic and atraumatic. No laceration.     Right Ear: Hearing normal.     Left Ear: Hearing normal.     Mouth/Throat:     Pharynx: Uvula midline.  Eyes:     Pupils: Pupils are equal, round, and reactive to light.  Neck:     Musculoskeletal: Normal range of motion and neck supple.     Thyroid: No thyromegaly.  Cardiovascular:     Rate and Rhythm: Normal rate and regular rhythm.     Heart sounds: No murmur. No friction rub. No gallop.   Pulmonary:     Effort: Pulmonary effort is normal. No respiratory distress.     Breath sounds: Normal breath sounds. No wheezing.  Chest:  Breasts:        Right: No mass, skin change or tenderness.        Left: No mass, skin change or tenderness.  Abdominal:     General: Bowel sounds are normal. There is no distension.     Palpations: Abdomen is soft.     Tenderness: There is no abdominal tenderness. There is no rebound.  Musculoskeletal: Normal range of motion.  Neurological:     Mental Status: She is alert and oriented to person, place, and time.     Cranial Nerves: No cranial nerve deficit.  Skin:    General: Skin is warm and dry.          Comments: RLE raised irreg mole ant surface leg  Psychiatric:        Judgment: Judgment normal.  Vitals signs reviewed.   Assessment: Annual Exam 1. Annual physical exam   2. Screening for breast cancer   3. Screen for colon cancer   4. Screening for cervical cancer   5. Acquired hypothyroidism   6. Screening for cholesterol level    7. Screening for diabetes mellitus   8. Encounter for vitamin deficiency screening    Plan:            1.  Cervical Screening-  Pap smear done today  2. Breast screening- Exam annually and mammogram scheduled  3. Colonoscopy every 10 years, Hemoccult testing after age 57; Cologuard ordered per pt preference  4. Labs Ordered today  5. Counseling for hormonal therapy: none  6. Hypothy, cont meds Encouraged to see PCP  7. Rec dermatology exam.  She has one concerning mole on leg.    F/U  Return in about 1 year (around 10/06/2019) for Annual.  Annamarie Major, MD, [name] Ob/G[facility], [facility] 10/06/2018  10:34 AM

## 2018-10-06 NOTE — Patient Instructions (Signed)
PAP every three years Mammogram every year    Call [phone] to schedule at [facility] Colonoscopy every 10 years, or Cologuard Labs yearly (with PCP)

## 2018-10-07 LAB — VITAMIN D 25 HYDROXY (VIT D DEFICIENCY, FRACTURES): VIT D 25 HYDROXY: 21.8 ng/mL — AB (ref 30.0–100.0)

## 2018-10-07 LAB — LIPID PANEL
Chol/HDL Ratio: 4.3 ratio (ref 0.0–4.4)
Cholesterol, Total: 164 mg/dL (ref 100–199)
HDL: 38 mg/dL — ABNORMAL LOW (ref >39)
LDL CALC: 113 mg/dL — AB (ref 0–99)
Triglycerides: 67 mg/dL (ref 0–149)
VLDL Cholesterol Cal: 13 mg/dL (ref 5–40)

## 2018-10-07 LAB — TSH: TSH: 4.45 u[IU]/mL (ref 0.450–4.500)

## 2018-10-07 LAB — GLUCOSE, FASTING: Glucose, Plasma: 93 mg/dL (ref 65–99)

## 2018-10-07 NOTE — Progress Notes (Signed)
Let her know labs are normal except for low Vitamin D, to take over the counter 1000 units daily to help. Cholesterol, diabetes, and thyroid normal.

## 2018-10-07 NOTE — Progress Notes (Signed)
Vitamin D level is low and in need of supplementation.  I recommend you take 1000 U per day of Vitamin D and we can recheck it again next year.  There are options for even higher dose prescription therapy if this does not work.  Vitamin D is important for bone health maintenance as well as other benefits. Normal cholesterol results, continue a healthy diet and exercise routine. Testing for diabetes is normal, great news! Thyroid hormone levels are normal.  Annamarie Major, MD, [name] Ob/Gyn, [facility] 10/07/2018  7:44 AM

## 2018-10-07 NOTE — Progress Notes (Signed)
Called pt, no ringtone, went to busy signal

## 2018-10-08 NOTE — Progress Notes (Signed)
Pt aware.

## 2018-10-12 LAB — CYTOLOGY - PAP: HPV: NOT DETECTED

## 2018-10-15 ENCOUNTER — Telehealth: Payer: Self-pay | Admitting: Obstetrics & Gynecology

## 2018-10-15 NOTE — Telephone Encounter (Signed)
Patient is schedule 10/26/18 °

## 2018-10-15 NOTE — Telephone Encounter (Signed)
-----   Message from Nadara Mustard, MD sent at 10/15/2018  7:52 AM EDT ----- Schedule COLPO w PH Pt aware of PAP results and awaiting scheduling

## 2018-10-15 NOTE — Progress Notes (Signed)
Schedule COLPO w PH Pt aware of PAP results and awaiting scheduling

## 2018-10-26 ENCOUNTER — Other Ambulatory Visit (HOSPITAL_COMMUNITY)
Admission: RE | Admit: 2018-10-26 | Discharge: 2018-10-26 | Disposition: A | Discharge status: Home / Self Care | Payer: BLUE CROSS/BLUE SHIELD | Source: Ambulatory Visit | Attending: Obstetrics & Gynecology | Admitting: Obstetrics & Gynecology

## 2018-10-26 ENCOUNTER — Ambulatory Visit (INDEPENDENT_AMBULATORY_CARE_PROVIDER_SITE_OTHER): Payer: BLUE CROSS/BLUE SHIELD | Admitting: Obstetrics & Gynecology

## 2018-10-26 ENCOUNTER — Encounter: Payer: Self-pay | Admitting: Obstetrics & Gynecology

## 2018-10-26 ENCOUNTER — Other Ambulatory Visit: Payer: Self-pay

## 2018-10-26 VITALS — BP 122/82 | Ht 63.0 in | Wt 217.0 lb

## 2018-10-26 DIAGNOSIS — N870 Mild cervical dysplasia: Secondary | ICD-10-CM | POA: Diagnosis not present

## 2018-10-26 DIAGNOSIS — R87612 Low grade squamous intraepithelial lesion on cytologic smear of cervix (LGSIL): Secondary | ICD-10-CM

## 2018-10-26 NOTE — Patient Instructions (Signed)
Colposcopy, Care After  This sheet gives you information about how to care for yourself after your procedure. Your health care provider may also give you more specific instructions. If you have problems or questions, contact your health care provider.  What can I expect after the procedure?  If you had a colposcopy without a biopsy, you can expect to feel fine right away, but you may have some spotting for a few days. You can go back to your normal activities.  If you had a colposcopy with a biopsy, it is common to have:   Soreness and pain. This may last for a few days.   Light-headedness.   Mild vaginal bleeding or dark-colored, grainy discharge. This may last for a few days. The discharge may be due to a solution that was used during the procedure. You may need to wear a sanitary pad during this time.   Spotting for at least 48 hours after the procedure.  Follow these instructions at home:     Take over-the-counter and prescription medicines only as told by your health care provider. Talk with your health care provider about what type of over-the-counter pain medicine and prescription medicine you can start taking again. It is especially important to talk with your health care provider if you take blood-thinning medicine.   Do not drive or use heavy machinery while taking prescription pain medicine.   For at least 3 days after your procedure, or as long as told by your health care provider, avoid:  ? Douching.  ? Using tampons.  ? Having sexual intercourse.   Continue to use birth control (contraception).   Limit your physical activity for the first day after the procedure as told by your health care provider. Ask your health care provider what activities are safe for you.   It is up to you to get the results of your procedure. Ask your health care provider, or the department performing the procedure, when your results will be ready.   Keep all follow-up visits as told by your health care provider.  This is important.  Contact a health care provider if:   You develop a skin rash.  Get help right away if:   You are bleeding heavily from your vagina or you are passing blood clots. This includes using more than one sanitary pad per hour for 2 hours in a row.   You have a fever or chills.   You have pelvic pain.   You have abnormal, yellow-colored, or bad-smelling vaginal discharge. This could be a sign of infection.   You have severe pain or cramps in your lower abdomen that do not get better with medicine.   You feel light-headed or dizzy, or you faint.  Summary   If you had a colposcopy without a biopsy, you can expect to feel fine right away, but you may have some spotting for a few days. You can go back to your normal activities.   If you had a colposcopy with a biopsy, you may notice mild pain and spotting for 48 hours after the procedure.   Avoid douching, using tampons, and having sexual intercourse for 3 days after the procedure or as long as told by your health care provider.   Contact your health care provider if you have bleeding, severe pain, or signs of infection.  This information is not intended to replace advice given to you by your health care provider. Make sure you discuss any questions you have with your   health care provider.  Document Released: 05/11/2013 Document Revised: 03/07/2016 Document Reviewed: 03/07/2016  Elsevier Interactive Patient Education  2019 [employer]

## 2018-10-26 NOTE — Progress Notes (Signed)
HPI:  Crystal Ford is a 63 y.o.  [phone]  who presents today for evaluation and management of abnormal cervical cytology.    Dysplasia History:  LGSIL PAP recently No prior abnormal PAP  ROS:  Pertinent items are noted in HPI.  OB History  Gravida Para Term Preterm AB Living  6 6 4 2   6   SAB TAB Ectopic Multiple Live Births               # Outcome Date GA Lbr Len/2nd Weight Sex Delivery Anes PTL Lv  6 Preterm           5 Preterm           4 Term           3 Term           2 Term           1 Term             Past Medical History:  Diagnosis Date  . Subclinical hypothyroidism     History reviewed. No pertinent surgical history.  SOCIAL HISTORY: Social History   Substance and Sexual Activity  Alcohol Use Yes   Social History   Substance and Sexual Activity  Drug Use No     Family History  Problem Relation Age of Onset  . Breast cancer Mother     ALLERGIES:  Amoxicillin-pot clavulanate  Current Outpatient Medications on File Prior to Visit  Medication Sig Dispense Refill  . albuterol (PROVENTIL HFA) 108 (90 Base) MCG/ACT inhaler Inhale 1 puff into the lungs every 6 (six) hours as needed for wheezing or shortness of breath. 1 Inhaler 3  . levothyroxine (SYNTHROID, LEVOTHROID) 100 MCG tablet TAKE 1 TABLET BY MOUTH ONCE DAILY BEFORE BREAKFAST 30 tablet 11  . metFORMIN (GLUCOPHAGE) 500 MG tablet Take 1 tablet (500 mg total) by mouth 2 (two) times daily with a meal. 60 tablet 11   No current facility-administered medications on file prior to visit.     Physical Exam: -Vitals:  BP 122/82   Ht 5\' 3"  (1.6 m)   Wt 217 lb (98.4 kg)   BMI 38.44 kg/m  GEN: WD, WN, NAD.  A+ O x 3, good mood and affect. ABD:  NT, ND.  Soft, no masses.  No hernias noted.   Pelvic:   Vulva: Normal appearance.  No lesions.  Vagina: No lesions or abnormalities noted.  Support: Normal pelvic support.  Urethra No masses tenderness or scarring.  Meatus Normal size without  lesions or prolapse.  Cervix: See below.  Anus: Normal exam.  No lesions.  Perineum: Normal exam.  No lesions.        Bimanual   Uterus: Normal size.  Non-tender.  Mobile.  AV.  Adnexae: No masses.  Non-tender to palpation.  Cul-de-sac: Negative for abnormality.   PROCEDURE: 1.  Urine Pregnancy Test:  not done 2.  Colposcopy performed with 4% acetic acid after verbal consent obtained                                         -Aceto-white Lesions Location(s): none.              -Biopsy performed at 12, 6 o'clock               -ECC indicated and performed: Yes.       -  Biopsy sites made hemostatic with pressure, AgNO3, and/or Monsel's solution   -Satisfactory colposcopy: Yes.      -Evidence of Invasive cervical CA :  NO  ASSESSMENT:  Crystal Ford is a 63 y.o. [id number] here for  1. LGSIL on Pap smear of cervix   .  PLAN: 1.  I discussed the grading system of pap smears and HPV high risk viral types.  We will discuss and base management after colpo results return. 2. Follow up PAP 6 months, vs intervention if high grade dysplasia identified 3. Treatment of persistantly abnormal PAP smears and cervical dysplasia, even mild, is discussed w pt today in detail, as well as the pros and cons of Cryo and LEEP procedures. Will consider and discuss after results.      Annamarie Major, MD, [name] Ob[facility], [facility] 10/26/2018  8:42 AM

## 2018-10-29 NOTE — Progress Notes (Signed)
PAP LGSIL, Biopsy from colpo all CIN I PAP 6 mos LM to d/w pt

## 2018-11-02 NOTE — Progress Notes (Signed)
LM again.

## 2018-11-25 DIAGNOSIS — R062 Wheezing: Secondary | ICD-10-CM | POA: Diagnosis not present

## 2018-11-25 DIAGNOSIS — R05 Cough: Secondary | ICD-10-CM | POA: Diagnosis not present

## 2018-11-29 DIAGNOSIS — J310 Chronic rhinitis: Secondary | ICD-10-CM | POA: Diagnosis not present

## 2018-11-29 DIAGNOSIS — K219 Gastro-esophageal reflux disease without esophagitis: Secondary | ICD-10-CM | POA: Diagnosis not present

## 2018-11-29 DIAGNOSIS — J45909 Unspecified asthma, uncomplicated: Secondary | ICD-10-CM | POA: Diagnosis not present

## 2018-12-20 DIAGNOSIS — R05 Cough: Secondary | ICD-10-CM | POA: Diagnosis not present

## 2018-12-20 DIAGNOSIS — J4520 Mild intermittent asthma, uncomplicated: Secondary | ICD-10-CM | POA: Diagnosis not present

## 2018-12-20 DIAGNOSIS — J310 Chronic rhinitis: Secondary | ICD-10-CM | POA: Diagnosis not present

## 2019-04-28 ENCOUNTER — Ambulatory Visit: Payer: BLUE CROSS/BLUE SHIELD | Admitting: Obstetrics & Gynecology

## 2019-04-29 ENCOUNTER — Other Ambulatory Visit (HOSPITAL_COMMUNITY)
Admission: RE | Admit: 2019-04-29 | Discharge: 2019-04-29 | Disposition: A | Discharge status: Home / Self Care | Payer: BC Managed Care – PPO | Source: Ambulatory Visit | Attending: Obstetrics & Gynecology | Admitting: Obstetrics & Gynecology

## 2019-04-29 ENCOUNTER — Encounter: Payer: Self-pay | Admitting: Obstetrics & Gynecology

## 2019-04-29 ENCOUNTER — Other Ambulatory Visit: Payer: Self-pay

## 2019-04-29 ENCOUNTER — Ambulatory Visit (INDEPENDENT_AMBULATORY_CARE_PROVIDER_SITE_OTHER): Payer: BC Managed Care – PPO | Admitting: Obstetrics & Gynecology

## 2019-04-29 VITALS — BP 130/90 | Ht 63.0 in | Wt 222.0 lb

## 2019-04-29 DIAGNOSIS — N870 Mild cervical dysplasia: Secondary | ICD-10-CM

## 2019-04-29 DIAGNOSIS — Z1239 Encounter for other screening for malignant neoplasm of breast: Secondary | ICD-10-CM

## 2019-04-29 DIAGNOSIS — Z1211 Encounter for screening for malignant neoplasm of colon: Secondary | ICD-10-CM | POA: Diagnosis not present

## 2019-04-29 NOTE — Patient Instructions (Signed)
Mammogram every year    Call [phone] to schedule at [facility] PAP- again in 6 months

## 2019-04-29 NOTE — Progress Notes (Signed)
HPI:  Patient is a 63 y.o. [id number] presenting for follow up evaluation of abnormal PAP smear in the past.  Her last PAP was 6 months ago and was abnormal: LGSIL. She has had a prior colposcopy. Prior biopsies (if done) were CIN I on all biopsies.  This was her first abnormal PAP.  She denies any new concerns at this time.  In need for MMG and Colonoscopy.  Pt states she prefers Cologuard.Marland Kitchen  PMHx: She  has a past medical history of Subclinical hypothyroidism. Also,  has no past surgical history on file., family history includes Breast cancer in her mother.,  reports that she has never smoked. She has never used smokeless tobacco. She reports current alcohol use. She reports that she does not use drugs.  She has a current medication list which includes the following prescription(s): albuterol, levothyroxine, and metformin. Also, is allergic to amoxicillin-pot clavulanate.  Review of Systems  All other systems reviewed and are negative.   Objective: BP 130/90   Ht 5\' 3"  (1.6 m)   Wt 222 lb (100.7 kg)   BMI 39.33 kg/m  Filed Weights   04/29/19 0807  Weight: 222 lb (100.7 kg)   Body mass index is 39.33 kg/m.  Physical examination Physical Exam Constitutional:      General: She is not in acute distress.    Appearance: She is well-developed.  Genitourinary:     Pelvic exam was performed with patient supine.     Vagina and uterus normal.     No vaginal erythema or bleeding.     No cervical motion tenderness, discharge, polyp or nabothian cyst.     Uterus is mobile.     Uterus is not enlarged.     No uterine mass detected.    Uterus is midaxial.     No right or left adnexal mass present.     Right adnexa not tender.     Left adnexa not tender.  HENT:     Head: Normocephalic and atraumatic.     Nose: Nose normal.  Abdominal:     General: There is no distension.     Palpations: Abdomen is soft.     Tenderness: There is no abdominal tenderness.  Musculoskeletal: Normal range of  motion.  Neurological:     Mental Status: She is alert and oriented to person, place, and time.     Cranial Nerves: No cranial nerve deficit.  Skin:    General: Skin is warm and dry.  Psychiatric:        Attention and Perception: Attention normal.        Mood and Affect: Mood and affect normal.        Speech: Speech normal.        Behavior: Behavior normal.        Thought Content: Thought content normal.        Judgment: Judgment normal.     ASSESSMENT:  History of Cervical Dysplasia   ICD-10-CM   1. CIN I (cervical intraepithelial neoplasia I)  N87.0 Cytology - PAP  2. Screening for breast cancer  Z12.39 MM 3D SCREEN BREAST BILATERAL  3. Screen for colon cancer  Z12.11 Cologuard ordered; counseled as to colonoscopy as well (declines)    Plan:  1.  I discussed the grading system of pap smears and HPV high risk viral types.   2. Follow up PAP 6 months, vs intervention if high grade dysplasia identified. 3. Treatment of persistantly abnormal PAP smears and cervical dysplasia,  even mild, is discussed w pt today in detail, as well as the pros and cons of Cryo and LEEP procedures. Will consider and discuss after results.  A total of 15 minutes were spent face-to-face with the patient during this encounter and over half of that time dealt with counseling and coordination of care.  Annamarie Major, MD, [name], [facility] 04/29/2019  8:23 AM

## 2019-05-04 LAB — CYTOLOGY - PAP
Diagnosis: NEGATIVE
Diagnosis: REACTIVE

## 2019-05-24 DIAGNOSIS — Z1211 Encounter for screening for malignant neoplasm of colon: Secondary | ICD-10-CM | POA: Diagnosis not present

## 2019-05-24 DIAGNOSIS — Z1212 Encounter for screening for malignant neoplasm of rectum: Secondary | ICD-10-CM | POA: Diagnosis not present

## 2019-05-30 LAB — COLOGUARD

## 2019-06-06 ENCOUNTER — Other Ambulatory Visit: Payer: Self-pay | Admitting: Obstetrics & Gynecology

## 2019-06-06 NOTE — Progress Notes (Signed)
Cologuard Neg

## 2019-06-16 DIAGNOSIS — L989 Disorder of the skin and subcutaneous tissue, unspecified: Secondary | ICD-10-CM | POA: Diagnosis not present

## 2019-06-16 DIAGNOSIS — Z23 Encounter for immunization: Secondary | ICD-10-CM | POA: Diagnosis not present

## 2019-06-16 DIAGNOSIS — Z131 Encounter for screening for diabetes mellitus: Secondary | ICD-10-CM | POA: Diagnosis not present

## 2019-06-16 DIAGNOSIS — Z13220 Encounter for screening for lipoid disorders: Secondary | ICD-10-CM | POA: Diagnosis not present

## 2019-06-16 DIAGNOSIS — Z0000 Encounter for general adult medical examination without abnormal findings: Secondary | ICD-10-CM | POA: Diagnosis not present

## 2019-06-16 DIAGNOSIS — Z1159 Encounter for screening for other viral diseases: Secondary | ICD-10-CM | POA: Diagnosis not present

## 2019-06-22 DIAGNOSIS — K219 Gastro-esophageal reflux disease without esophagitis: Secondary | ICD-10-CM | POA: Diagnosis not present

## 2019-06-22 DIAGNOSIS — J310 Chronic rhinitis: Secondary | ICD-10-CM | POA: Diagnosis not present

## 2019-06-22 DIAGNOSIS — J4520 Mild intermittent asthma, uncomplicated: Secondary | ICD-10-CM | POA: Diagnosis not present

## 2019-06-22 DIAGNOSIS — L989 Disorder of the skin and subcutaneous tissue, unspecified: Secondary | ICD-10-CM | POA: Diagnosis not present

## 2019-06-22 DIAGNOSIS — L821 Other seborrheic keratosis: Secondary | ICD-10-CM | POA: Diagnosis not present

## 2019-10-27 ENCOUNTER — Other Ambulatory Visit (HOSPITAL_COMMUNITY)
Admission: RE | Admit: 2019-10-27 | Discharge: 2019-10-27 | Disposition: A | Discharge status: Home / Self Care | Payer: BC Managed Care – PPO | Source: Ambulatory Visit | Attending: Obstetrics & Gynecology | Admitting: Obstetrics & Gynecology

## 2019-10-27 ENCOUNTER — Ambulatory Visit (INDEPENDENT_AMBULATORY_CARE_PROVIDER_SITE_OTHER): Payer: BC Managed Care – PPO | Admitting: Obstetrics & Gynecology

## 2019-10-27 ENCOUNTER — Other Ambulatory Visit: Payer: Self-pay

## 2019-10-27 ENCOUNTER — Encounter: Payer: Self-pay | Admitting: Obstetrics & Gynecology

## 2019-10-27 VITALS — BP 130/80 | Ht 63.0 in | Wt 217.0 lb

## 2019-10-27 DIAGNOSIS — N870 Mild cervical dysplasia: Secondary | ICD-10-CM

## 2019-10-27 DIAGNOSIS — E039 Hypothyroidism, unspecified: Secondary | ICD-10-CM

## 2019-10-27 DIAGNOSIS — E559 Vitamin D deficiency, unspecified: Secondary | ICD-10-CM | POA: Insufficient documentation

## 2019-10-27 DIAGNOSIS — Z1231 Encounter for screening mammogram for malignant neoplasm of breast: Secondary | ICD-10-CM

## 2019-10-27 DIAGNOSIS — Z01411 Encounter for gynecological examination (general) (routine) with abnormal findings: Secondary | ICD-10-CM

## 2019-10-27 DIAGNOSIS — Z01419 Encounter for gynecological examination (general) (routine) without abnormal findings: Secondary | ICD-10-CM

## 2019-10-27 NOTE — Patient Instructions (Signed)
PAP every 6 months for now Mammogram every year - need soon     Call [phone] to schedule at [facility] every 3 years Labs today

## 2019-10-27 NOTE — Progress Notes (Signed)
HPI:      Crystal Ford is a 64 y.o. [phone] who LMP was No LMP recorded. Patient is postmenopausal., she presents today for her annual examination. The patient has no complaints today. The patient is sexually active. Her last pap: approximate date 04/2019 and was normal and last year she had LGSIL and CIN I by Biopsy; and last mammogram: approximate date 2017 and was normal. The patient does perform self breast exams.  There is notable family history of breast or ovarian cancer in her family.  The patient has regular exercise: yes.  The patient denies current symptoms of depression.    GYN History: Cologuard 05/2019  PMHx: Past Medical History:  Diagnosis Date  . Subclinical hypothyroidism    History reviewed. No pertinent surgical history. Family History  Problem Relation Age of Onset  . Breast cancer Mother    Social History   Tobacco Use  . Smoking status: Never Smoker  . Smokeless tobacco: Never Used  Substance Use Topics  . Alcohol use: Yes  . Drug use: No    Current Outpatient Medications:  .  ADVAIR DISKUS 250-50 MCG/DOSE AEPB, SMARTSIG:1 Unspecified By Mouth Every 12 Hours, Disp: , Rfl:  .  albuterol (PROVENTIL HFA) 108 (90 Base) MCG/ACT inhaler, Inhale 1 puff into the lungs every 6 (six) hours as needed for wheezing or shortness of breath., Disp: 1 Inhaler, Rfl: 3 .  levothyroxine (SYNTHROID, LEVOTHROID) 100 MCG tablet, TAKE 1 TABLET BY MOUTH ONCE DAILY BEFORE BREAKFAST, Disp: 30 tablet, Rfl: 11 .  metFORMIN (GLUCOPHAGE) 500 MG tablet, Take 1 tablet (500 mg total) by mouth 2 (two) times daily with a meal., Disp: 60 tablet, Rfl: 11 Allergies: Amoxicillin-pot clavulanate  Review of Systems  Constitutional: Positive for malaise/fatigue. Negative for chills and fever.  HENT: Negative for congestion, sinus pain and sore throat.   Eyes: Negative for blurred vision and pain.  Respiratory: Negative for cough and wheezing.   Cardiovascular: Negative for chest pain  and leg swelling.  Gastrointestinal: Negative for abdominal pain, constipation, diarrhea, heartburn, nausea and vomiting.  Genitourinary: Negative for dysuria, frequency, hematuria and urgency.  Musculoskeletal: Negative for back pain, joint pain, myalgias and neck pain.  Skin: Negative for itching and rash.  Neurological: Negative for dizziness, tremors and weakness.  Endo/Heme/Allergies: Does not bruise/bleed easily.  Psychiatric/Behavioral: Negative for depression. The patient is not nervous/anxious and does not have insomnia.     Objective: BP 130/80   Ht 5\' 3"  (1.6 m)   Wt 217 lb (98.4 kg)   BMI 38.44 kg/m   Filed Weights   10/27/19 0809  Weight: 217 lb (98.4 kg)   Body mass index is 38.44 kg/m. Physical Exam Constitutional:      General: She is not in acute distress.    Appearance: She is well-developed.  Genitourinary:     Pelvic exam was performed with patient supine.     Vagina, uterus and rectum normal.     No lesions in the vagina.     No vaginal bleeding.     No cervical motion tenderness, friability, lesion or polyp.     Uterus is mobile.     Uterus is not enlarged.     No uterine mass detected.    Uterus is midaxial.     No right or left adnexal mass present.     Right adnexa not tender.     Left adnexa not tender.  HENT:     Head: Normocephalic and atraumatic.  No laceration.     Right Ear: Hearing normal.     Left Ear: Hearing normal.     Mouth/Throat:     Pharynx: Uvula midline.  Eyes:     Pupils: Pupils are equal, round, and reactive to light.  Neck:     Thyroid: No thyromegaly.  Cardiovascular:     Rate and Rhythm: Normal rate and regular rhythm.     Heart sounds: No murmur. No friction rub. No gallop.   Pulmonary:     Effort: Pulmonary effort is normal. No respiratory distress.     Breath sounds: Normal breath sounds. No wheezing.  Chest:     Breasts:        Right: No mass, skin change or tenderness.        Left: No mass, skin change or  tenderness.  Abdominal:     General: Bowel sounds are normal. There is no distension.     Palpations: Abdomen is soft.     Tenderness: There is no abdominal tenderness. There is no rebound.  Musculoskeletal:        General: Normal range of motion.     Cervical back: Normal range of motion and neck supple.  Neurological:     Mental Status: She is alert and oriented to person, place, and time.     Cranial Nerves: No cranial nerve deficit.  Skin:    General: Skin is warm and dry.  Psychiatric:        Judgment: Judgment normal.  Vitals reviewed.     Assessment:  ANNUAL EXAM 1. Women's annual routine gynecological examination   2. CIN I (cervical intraepithelial neoplasia I)   3. Encounter for screening mammogram for malignant neoplasm of breast   4. Vitamin D deficiency   5. Hypothyroidism, unspecified type      Screening Plan:            1.  Cervical Screening-  Pap smear done today Prior CIN I  2. Breast screening- Exam annually and mammogram>40 planned   3. Cologuard every 3 years, Hemoccult testing - after age 13  4. Labs Ordered today - Basic Metabolic Panel (BMET) - Magnesium   Vitamin D deficiency - VITAMIN D 25 Hydroxy (Vit-D Deficiency, Fractures)   Hypothyroidism, unspecified type - TSH      F/U  Return in about 6 months (around 04/28/2020) for Follow up.  Barnett Applebaum, MD, [facility], [facility] 10/27/2019  8:31 AM

## 2019-10-28 LAB — VITAMIN D 25 HYDROXY (VIT D DEFICIENCY, FRACTURES): Vit D, 25-Hydroxy: 29.3 ng/mL — ABNORMAL LOW (ref 30.0–100.0)

## 2019-10-28 LAB — BASIC METABOLIC PANEL
BUN/Creatinine Ratio: 9 — ABNORMAL LOW (ref 12–28)
BUN: 8 mg/dL (ref 8–27)
CO2: 27 mmol/L (ref 20–29)
Calcium: 9.2 mg/dL (ref 8.7–10.3)
Chloride: 104 mmol/L (ref 96–106)
Creatinine, Ser: 0.9 mg/dL (ref 0.57–1.00)
GFR calc Af Amer: 80 mL/min/{1.73_m2} (ref >59)
GFR calc non Af Amer: 69 mL/min/{1.73_m2} (ref >59)
Glucose: 106 mg/dL — ABNORMAL HIGH (ref 65–99)
Potassium: 4.4 mmol/L (ref 3.5–5.2)
Sodium: 143 mmol/L (ref 134–144)

## 2019-10-28 LAB — TSH: TSH: 7.16 u[IU]/mL — ABNORMAL HIGH (ref 0.450–4.500)

## 2019-10-28 LAB — MAGNESIUM: Magnesium: 2.1 mg/dL (ref 1.6–2.3)

## 2019-10-31 LAB — CYTOLOGY - PAP: Diagnosis: UNDETERMINED — AB

## 2019-12-01 ENCOUNTER — Telehealth: Payer: Self-pay

## 2019-12-01 NOTE — Telephone Encounter (Signed)
Left voice message to advise pt to schedule her annual

## 2019-12-01 NOTE — Telephone Encounter (Signed)
-----   Message from Nadara Mustard, MD sent at 11/22/2019 10:34 AM EDT ----- Regarding: MMG Received notice she has not received MMG yet as ordered at her Annual. Please check and encourage her to do this, and document conversation.

## 2020-02-10 ENCOUNTER — Telehealth: Payer: Self-pay

## 2020-02-10 NOTE — Telephone Encounter (Signed)
[facility] calling about PA for pt on Levothyroxin tablets.  Please call [phone] asap - ref# [phone]

## 2020-02-13 NOTE — Telephone Encounter (Signed)
[facility]  refilling the rx , The same medication just a different manufacture, may be different color or shape.

## 2020-03-01 ENCOUNTER — Telehealth: Payer: Self-pay

## 2020-03-01 NOTE — Telephone Encounter (Signed)
Pt states she is aware to schedule her mammogram

## 2020-03-01 NOTE — Telephone Encounter (Signed)
-----   Message from Nadara Mustard, MD sent at 02/26/2020 11:19 PM EDT ----- Regarding: MMG Received notice she has not received MMG yet as ordered at her Annual. Please check and encourage her to do this, and document conversation.

## 2020-03-19 ENCOUNTER — Other Ambulatory Visit: Payer: Self-pay | Admitting: Obstetrics & Gynecology

## 2020-03-29 ENCOUNTER — Other Ambulatory Visit: Payer: Self-pay | Admitting: Obstetrics & Gynecology

## 2020-05-24 ENCOUNTER — Other Ambulatory Visit: Payer: Self-pay | Admitting: Obstetrics & Gynecology

## 2020-05-24 ENCOUNTER — Telehealth: Payer: Self-pay | Admitting: Obstetrics & Gynecology

## 2020-05-24 DIAGNOSIS — Z1231 Encounter for screening mammogram for malignant neoplasm of breast: Secondary | ICD-10-CM

## 2020-05-24 NOTE — Telephone Encounter (Signed)
-----   Message from Nadara Mustard, MD sent at 05/24/2020  9:42 AM EDT ----- Regarding: appt Sch Annual appt in April 2022 and also encourage her to have MMG done by then, Thx!

## 2020-05-24 NOTE — Telephone Encounter (Signed)
Patient is scheduled for 10/30/19 with Dr. Tiburcio Pea. Patient is aware to contact [facility] for mammogram

## 2020-10-29 ENCOUNTER — Ambulatory Visit: Payer: BC Managed Care – PPO | Admitting: Obstetrics & Gynecology

## 2021-05-20 ENCOUNTER — Other Ambulatory Visit: Payer: Self-pay | Admitting: Obstetrics & Gynecology

## 2021-05-20 DIAGNOSIS — Z1231 Encounter for screening mammogram for malignant neoplasm of breast: Secondary | ICD-10-CM

## 2021-05-21 ENCOUNTER — Telehealth: Payer: Self-pay

## 2021-05-21 NOTE — Telephone Encounter (Signed)
Attempt to reach patient . Phone on file not accepting calls. Unable to leave message.

## 2021-05-21 NOTE — Telephone Encounter (Signed)
-----   Message from Nadara Mustard, MD sent at 05/20/2021  4:57 PM EDT ----- Regarding: sch annual

## 2021-07-02 ENCOUNTER — Ambulatory Visit (INDEPENDENT_AMBULATORY_CARE_PROVIDER_SITE_OTHER): Payer: BC Managed Care – PPO | Admitting: Obstetrics & Gynecology

## 2021-07-02 ENCOUNTER — Encounter: Payer: Self-pay | Admitting: Obstetrics & Gynecology

## 2021-07-02 ENCOUNTER — Other Ambulatory Visit: Payer: Self-pay

## 2021-07-02 ENCOUNTER — Other Ambulatory Visit (HOSPITAL_COMMUNITY)
Admission: RE | Admit: 2021-07-02 | Discharge: 2021-07-02 | Disposition: A | Discharge status: Home / Self Care | Payer: BC Managed Care – PPO | Source: Ambulatory Visit | Attending: Obstetrics & Gynecology | Admitting: Obstetrics & Gynecology

## 2021-07-02 VITALS — BP 122/80 | Ht 63.0 in | Wt 209.0 lb

## 2021-07-02 DIAGNOSIS — Z13820 Encounter for screening for osteoporosis: Secondary | ICD-10-CM

## 2021-07-02 DIAGNOSIS — N870 Mild cervical dysplasia: Secondary | ICD-10-CM | POA: Diagnosis not present

## 2021-07-02 DIAGNOSIS — Z1231 Encounter for screening mammogram for malignant neoplasm of breast: Secondary | ICD-10-CM | POA: Diagnosis not present

## 2021-07-02 DIAGNOSIS — Z01419 Encounter for gynecological examination (general) (routine) without abnormal findings: Secondary | ICD-10-CM | POA: Diagnosis not present

## 2021-07-02 NOTE — Progress Notes (Signed)
HPI:      [name] is a 65 y.o. [id number] presents today for her annual examination.  The patient has no complaints today. The patient is sexually active. Herlast pap: approximate date 2021 and was abnormal: ASCUS; and last mammogram: approximate date 2017 and was normal.  Prior CIN I by Colpo Bx.  The patient does perform self breast exams.  There is notable family history of breast or ovarian cancer in her family. The patient is not taking hormone replacement therapy. Patient denies post-menopausal vaginal bleeding.   The patient has regular exercise: yes. The patient denies current symptoms of depression.    GYN Hx: Last ColoGuard:2 years ago. Normal.  Last DEXA:  never  ago.    PMHx: Past Medical History:  Diagnosis Date   Subclinical hypothyroidism    Past Surgical History:  Procedure Laterality Date   EYELID LACERATION REPAIR     Family History  Problem Relation Age of Onset   Breast cancer Mother    Social History   Tobacco Use   Smoking status: Never   Smokeless tobacco: Never  Vaping Use   Vaping Use: Never used  Substance Use Topics   Alcohol use: Yes   Drug use: No    Current Outpatient Medications:    ADVAIR DISKUS 250-50 MCG/DOSE AEPB, SMARTSIG:1 Unspecified By Mouth Every 12 Hours, Disp: , Rfl:    albuterol (PROVENTIL HFA) 108 (90 Base) MCG/ACT inhaler, Inhale 1 puff into the lungs every 6 (six) hours as needed for wheezing or shortness of breath., Disp: 1 Inhaler, Rfl: 3   levothyroxine (SYNTHROID) 100 MCG tablet, TAKE 1 TABLET DAILY, Disp: 90 tablet, Rfl: 3   metFORMIN (GLUCOPHAGE) 500 MG tablet, TAKE 1 TABLET TWICE A DAY, Disp: 180 tablet, Rfl: 3 Allergies: Amoxicillin-pot clavulanate  Review of Systems  Constitutional:  Negative for chills, fever and malaise/fatigue.  HENT:  Negative for congestion, sinus pain and sore throat.   Eyes:  Negative for blurred vision and pain.  Respiratory:  Negative for cough and wheezing.   Cardiovascular:   Negative for chest pain and leg swelling.  Gastrointestinal:  Negative for abdominal pain, constipation, diarrhea, heartburn, nausea and vomiting.  Genitourinary:  Negative for dysuria, frequency, hematuria and urgency.  Musculoskeletal:  Negative for back pain, joint pain, myalgias and neck pain.  Skin:  Negative for itching and rash.  Neurological:  Negative for dizziness, tremors and weakness.  Endo/Heme/Allergies:  Does not bruise/bleed easily.  Psychiatric/Behavioral:  Negative for depression. The patient is not nervous/anxious and does not have insomnia.    Objective: BP 122/80   Ht 5\' 3"  (1.6 m)   Wt 209 lb (94.8 kg)   BMI 37.02 kg/m   Filed Weights   07/02/21 0950  Weight: 209 lb (94.8 kg)   Body mass index is 37.02 kg/m. Physical Exam Constitutional:      General: She is not in acute distress.    Appearance: She is well-developed.  Genitourinary:     Bladder, rectum and urethral meatus normal.     No lesions in the vagina.     Right Labia: No rash, tenderness or lesions.    Left Labia: No tenderness, lesions or rash.    No vaginal bleeding.      Right Adnexa: not tender and no mass present.    Left Adnexa: not tender and no mass present.    No cervical motion tenderness, friability, lesion or polyp.     Uterus is not enlarged.  No uterine mass detected.    Pelvic exam was performed with patient in the lithotomy position.  Breasts:    Right: No mass, skin change or tenderness.     Left: No mass, skin change or tenderness.  HENT:     Head: Normocephalic and atraumatic. No laceration.     Right Ear: Hearing normal.     Left Ear: Hearing normal.     Mouth/Throat:     Pharynx: Uvula midline.  Eyes:     Pupils: Pupils are equal, round, and reactive to light.  Neck:     Thyroid: No thyromegaly.  Cardiovascular:     Rate and Rhythm: Normal rate and regular rhythm.     Heart sounds: No murmur heard.   No friction rub. No gallop.  Pulmonary:     Effort:  Pulmonary effort is normal. No respiratory distress.     Breath sounds: Normal breath sounds. No wheezing.  Abdominal:     General: Bowel sounds are normal. There is no distension.     Palpations: Abdomen is soft.     Tenderness: There is no abdominal tenderness. There is no rebound.  Musculoskeletal:        General: Normal range of motion.     Cervical back: Normal range of motion and neck supple.  Neurological:     Mental Status: She is alert and oriented to person, place, and time.     Cranial Nerves: No cranial nerve deficit.  Skin:    General: Skin is warm and dry.  Psychiatric:        Judgment: Judgment normal.  Vitals reviewed.    Assessment: Annual Exam 1. Women's annual routine gynecological examination   2. CIN I (cervical intraepithelial neoplasia I)   3. Visit for screening mammogram     Plan:            1.  Cervical Screening-  Pap smear done today, due to prior CIN I, will cont yearly  2. Breast screening- Exam annually and mammogram scheduled Pt has missed last appt for MMG, yet agrees to do it soon  3. Colonoscopy every 10 years or Cologuard every 3 years (due 2023)  4. Labs managed by PCP  5. Counseling for hormonal therapy: none              6. FRAX - FRAX score for assessing the 10 year probability for fracture calculated and discussed today.  Based on age and score today, DEXA is scheduled.    F/U  Return in about 1 year (around 07/02/2022) for Annual.  Barnett Applebaum, MD, [facility], [facility] 07/02/2021  9:57 AM

## 2021-07-02 NOTE — Patient Instructions (Signed)
PAP every year Mammogram every year    Call [phone] to schedule at [facility] Colonoscopy every 10 years    Or ColoGuard every 3 years (due 2023) Labs yearly (with PCP)  Thank you for choosing [facility]. As part of our ongoing efforts to improve patient experience, we would appreciate your feedback. Please fill out the short survey that you will receive by mail or MyChart. Your opinion is important to [place]! - Dr. Tiburcio Pea

## 2021-07-09 LAB — CYTOLOGY - PAP: Diagnosis: NEGATIVE

## 2021-09-10 ENCOUNTER — Other Ambulatory Visit: Payer: Self-pay | Admitting: Obstetrics & Gynecology

## 2021-09-10 DIAGNOSIS — Z1231 Encounter for screening mammogram for malignant neoplasm of breast: Secondary | ICD-10-CM

## 2021-09-10 DIAGNOSIS — Z13820 Encounter for screening for osteoporosis: Secondary | ICD-10-CM

## 2021-10-22 ENCOUNTER — Ambulatory Visit
Admission: RE | Admit: 2021-10-22 | Discharge: 2021-10-22 | Disposition: A | Discharge status: Home / Self Care | Payer: BC Managed Care – PPO | Source: Ambulatory Visit | Attending: Obstetrics & Gynecology | Admitting: Obstetrics & Gynecology

## 2021-10-22 ENCOUNTER — Other Ambulatory Visit: Payer: Self-pay

## 2021-10-22 DIAGNOSIS — Z1231 Encounter for screening mammogram for malignant neoplasm of breast: Secondary | ICD-10-CM | POA: Insufficient documentation

## 2021-10-22 DIAGNOSIS — Z13820 Encounter for screening for osteoporosis: Secondary | ICD-10-CM

## 2022-10-07 ENCOUNTER — Ambulatory Visit: Admit: 2022-10-07 | Payer: BC Managed Care – PPO | Admitting: Otolaryngology

## 2022-10-07 SURGERY — SINUS SURGERY, WITH IMAGING GUIDANCE
Anesthesia: General | Laterality: Right

## 2022-12-12 ENCOUNTER — Other Ambulatory Visit: Payer: Self-pay | Admitting: Family Medicine

## 2022-12-12 DIAGNOSIS — Z9189 Other specified personal risk factors, not elsewhere classified: Secondary | ICD-10-CM

## 2022-12-12 DIAGNOSIS — Z1231 Encounter for screening mammogram for malignant neoplasm of breast: Secondary | ICD-10-CM

## 2022-12-12 DIAGNOSIS — Z0000 Encounter for general adult medical examination without abnormal findings: Secondary | ICD-10-CM

## 2023-01-05 ENCOUNTER — Ambulatory Visit
Admission: RE | Admit: 2023-01-05 | Discharge: 2023-01-05 | Disposition: A | Discharge status: Home / Self Care | Payer: BC Managed Care – PPO | Source: Ambulatory Visit | Attending: Family Medicine | Admitting: Family Medicine

## 2023-01-05 DIAGNOSIS — Z1231 Encounter for screening mammogram for malignant neoplasm of breast: Secondary | ICD-10-CM
